# Patient Record
Sex: Female | Born: 1975
Health system: Southern US, Community
[De-identification: ages and names within clinical notes are randomized; demographics above are authoritative.]

## PROBLEM LIST (undated history)

## (undated) DIAGNOSIS — E559 Vitamin D deficiency, unspecified: Secondary | ICD-10-CM

## (undated) DIAGNOSIS — M4120 Other idiopathic scoliosis, site unspecified: Secondary | ICD-10-CM

## (undated) HISTORY — DX: Other idiopathic scoliosis, site unspecified: M41.20

## (undated) HISTORY — DX: Vitamin D deficiency, unspecified: E55.9

---

## 1998-10-24 HISTORY — PX: BREAST ENHANCEMENT SURGERY: SHX7

## 2003-10-25 HISTORY — PX: CHOLECYSTECTOMY: SHX55

## 2013-10-29 ENCOUNTER — Encounter: Payer: Self-pay | Admitting: Gastroenterology

## 2014-02-14 ENCOUNTER — Encounter: Payer: Self-pay | Admitting: Gastroenterology

## 2014-03-11 ENCOUNTER — Encounter: Payer: Self-pay | Admitting: Gastroenterology

## 2014-03-11 ENCOUNTER — Ambulatory Visit (INDEPENDENT_AMBULATORY_CARE_PROVIDER_SITE_OTHER): Payer: BC Managed Care – PPO | Admitting: Gastroenterology

## 2014-03-11 VITALS — BP 114/74 | HR 78 | Ht 66.0 in | Wt 152.0 lb

## 2014-03-11 DIAGNOSIS — Z800 Family history of malignant neoplasm of digestive organs: Secondary | ICD-10-CM | POA: Insufficient documentation

## 2014-03-11 MED ORDER — MOVIPREP 100 G PO SOLR: 1.0000 | Freq: Once | ORAL | Status: DC

## 2014-03-11 NOTE — Progress Notes (Signed)
03/11/2014 Donna Salazar [id number] November 12, 1975   HISTORY OF PRESENT ILLNESS: This is a pleasant 38 year old female who is known to Dr. Fuller Plan for previous colonoscopy in February 2005. Her colonoscopy at that time was normal. Her father died of colon cancer at age 93, just 6 months after being diagnosed. She presents for office today to schedule her colonoscopy, but also she recently had an episode of possible colonic ileus. She was complaining of some periumbilical abdominal pain, bloating, and nausea. She underwent a CT scan of the abdomen and pelvis with contrast on May 14 and was found to have nonspecific bowel gas pattern that was somewhat atypical with no absolute distention but with mild relative gaseous distention of the stomach and large bowel from the level of the cecum to the splenic flexure. The more distal large bowel was decompressed. They commented that these findings may be of no clinical consequence but possibility of a colonic ileus was considered. There was a possibility of internal hernia but no specific evidence to suggest that otherwise was noted. There was physiologic volume of free fluid in the cul-de-sac and pelvis. She underwent an abdominal x-ray a couple of days later and that reported nonspecific non-obstrcutive bowel gas pattern with clearance of colonic stool in the right colon and cecum along with no residual contrast in the colon. There was only mild gas in the hepatic flexure of the colon with no distention of the transverse colon.  There was paucity of bowel gas in the left colon, sigmoid colon, and rectum.    She says that overall she is feeling better, but still has some soreness just above her umbilicus; it is much improved, however.   Past Medical History  Diagnosis Date  . Vitamin D deficiency   . Scoliosis (and kyphoscoliosis), idiopathic    Past Surgical History  Procedure Laterality Date  . Cholecystectomy  2005    reports that she has never  smoked. She has never used smokeless tobacco. She reports that she does not drink alcohol or use illicit drugs. family history includes Colon cancer (age of onset: 66) in her father. Allergies  Allergen Reactions  . Cefdinir Rash  . Cephalosporins Rash  . Penicillins Rash      Outpatient Encounter Prescriptions as of 03/11/2014  Medication Sig  . CALCIUM-VITAMIN D PO Take by mouth daily.  . ondansetron (ZOFRAN-ODT) 4 MG disintegrating tablet Take 4 mg by mouth every 8 (eight) hours as needed for nausea.  Marland Kitchen MOVIPREP 100 G SOLR Take 1 kit (200 g total) by mouth once.  . [DISCONTINUED] methocarbamol (ROBAXIN-750) 750 MG tablet Take 750 mg by mouth every 6 (six) hours as needed for muscle spasms.  . [DISCONTINUED] traMADol (ULTRAM) 50 MG tablet Take 50 mg by mouth every 6 (six) hours as needed.     REVIEW OF SYSTEMS  : All other systems reviewed and negative except where noted in the History of Present Illness.   PHYSICAL EXAM: BP 114/74  Pulse 78  Ht 5' 6"  (1.676 m)  Wt 152 lb (68.947 kg)  BMI 24.55 kg/m2  LMP 02/24/2014 General: Well developed white female in no acute distress Head: Normocephalic and atraumatic Eyes:  Sclerae anicteric, conjunctiva pink. Ears: Normal auditory acuity  Lungs: Clear throughout to auscultation Heart: Regular rate and rhythm Abdomen: Soft, non-distended.  Normal bowel sounds.  Minimal diffuse TTP with slightly more tenderness just above the umbilicus.  No umbilical hernia noted. Rectal:  Deferred.  Will be done at the time of  colonoscopy. Musculoskeletal: Symmetrical with no gross deformities  Skin: No lesions on visible extremities Extremities: No edema  Neurological: Alert oriented x 4, grossly non-focal Psychological:  Alert and cooperative. Normal mood and affect  ASSESSMENT AND PLAN: -Family history of colon cancer in her father:  He died at age 48 just 67 months after he was diagnosed.  Will schedule colonoscopy with Dr. Fuller Plan.  The risks,  benefits, and alternatives were discussed with the patient and she consents to proceed.  -? Colonic ileus:  Now improved/resolved.

## 2014-03-11 NOTE — Patient Instructions (Signed)

## 2014-03-11 NOTE — Progress Notes (Signed)
Reviewed and agree with management plan.  Meosha Castanon T. Tyrone Pautsch, MD FACG 

## 2014-04-02 ENCOUNTER — Encounter: Payer: Self-pay | Admitting: Gastroenterology

## 2014-04-02 ENCOUNTER — Ambulatory Visit (AMBULATORY_SURGERY_CENTER): Payer: BC Managed Care – PPO | Admitting: Gastroenterology

## 2014-04-02 VITALS — BP 109/69 | HR 56 | Temp 97.9°F | Resp 16 | Ht 66.0 in | Wt 152.0 lb

## 2014-04-02 DIAGNOSIS — Z800 Family history of malignant neoplasm of digestive organs: Secondary | ICD-10-CM

## 2014-04-02 MED ORDER — SODIUM CHLORIDE 0.9 % IV SOLN: 500.0000 mL | INTRAVENOUS | Status: DC

## 2014-04-02 NOTE — Progress Notes (Signed)
No complaints noted in the recovery room. Maw   

## 2014-04-02 NOTE — Op Note (Signed)
[facility] [address] [place], [address]   COLONOSCOPY PROCEDURE REPORT  PATIENT: Donna Salazar, Donna Salazar  MR#: [id number] BIRTHDATE: [birth year] , 37  yrs. old GENDER: Female ENDOSCOPIST: Meryl Dare, MD, [facility] PROCEDURE DATE:  04/02/2014 PROCEDURE:   Colonoscopy, screening First Screening Colonoscopy - Avg.  risk and is 50 yrs.  old or older - No.  Prior Negative Screening - Now for repeat screening. 10 or more years since last screening  History of Adenoma - Now for follow-up colonoscopy & has been > or = to 3 yrs.  N/A  Polyps Removed Today? No.  Recommend repeat exam, <10 yrs? Yes.  High risk (family or personal hx). ASA CLASS:   Class I INDICATIONS:elevated risk screening: patient's immediate family history of colon cancer, father at 5. MEDICATIONS: MAC sedation, administered by CRNA and propofol (Diprivan) 350mg  IV DESCRIPTION OF PROCEDURE:   After the risks benefits and alternatives of the procedure were thoroughly explained, informed consent was obtained.  A digital rectal exam revealed no abnormalities of the rectum.   The LB [id number]  endoscope was introduced through the anus and advanced to the cecum, which was identified by both the appendix and ileocecal valve. No adverse events experienced.   The quality of the prep was excellent, using MoviPrep  The instrument was then slowly withdrawn as the colon was fully examined.  COLON FINDINGS: A normal appearing cecum, ileocecal valve, and appendiceal orifice were identified.  The ascending, hepatic flexure, transverse, splenic flexure, descending, sigmoid colon and rectum appeared unremarkable. Possible mild dilation of the ascending and transverse colon.  No polyps or cancers were seen. Retroflexed views revealed internal hemorrhoids. The time to cecum=3 minutes 27 seconds.  Withdrawal time=9 minutes 40 seconds. The scope was withdrawn and the procedure completed. COMPLICATIONS:  There were no complications.  ENDOSCOPIC IMPRESSION: 1.  Normal colon 2.  Small internal hemorrhoids  RECOMMENDATIONS: 1.  Repeat Colonoscopy in 5 years.  eSigned:  Meryl Dare, MD, [facility] 04/02/2014 11:33 AM   cc: Philemon Kingdom, MD

## 2014-04-02 NOTE — Progress Notes (Signed)
A/ox3 pleased with MAC, report to Annette RN 

## 2014-04-02 NOTE — Patient Instructions (Signed)
YOU HAD AN ENDOSCOPIC PROCEDURE TODAY AT THE  ENDOSCOPY CENTER: Refer to the procedure report that was given to you for any specific questions about what was found during the examination.  If the procedure report does not answer your questions, please call your gastroenterologist to clarify.  If you requested that your care partner not be given the details of your procedure findings, then the procedure report has been included in a sealed envelope for you to review at your convenience later.  YOU SHOULD EXPECT: Some feelings of bloating in the abdomen. Passage of more gas than usual.  Walking can help get rid of the air that was put into your GI tract during the procedure and reduce the bloating. If you had a lower endoscopy (such as a colonoscopy or flexible sigmoidoscopy) you may notice spotting of blood in your stool or on the toilet paper. If you underwent a bowel prep for your procedure, then you may not have a normal bowel movement for a few days.  DIET: Your first meal following the procedure should be a light meal and then it is ok to progress to your normal diet.  A half-sandwich or bowl of soup is an example of a good first meal.  Heavy or fried foods are harder to digest and may make you feel nauseous or bloated.  Likewise meals heavy in dairy and vegetables can cause extra gas to form and this can also increase the bloating.  Drink plenty of fluids but you should avoid alcoholic beverages for 24 hours.  ACTIVITY: Your care partner should take you home directly after the procedure.  You should plan to take it easy, moving slowly for the rest of the day.  You can resume normal activity the day after the procedure however you should NOT DRIVE or use heavy machinery for 24 hours (because of the sedation medicines used during the test).    SYMPTOMS TO REPORT IMMEDIATELY: A gastroenterologist can be reached at any hour.  During normal business hours, 8:30 AM to 5:00 PM Monday through Friday,  call [phone].  After hours and on weekends, please call the GI answering service at [phone] who will take a message and have the physician on call contact you.   Following lower endoscopy (colonoscopy or flexible sigmoidoscopy):  Excessive amounts of blood in the stool  Significant tenderness or worsening of abdominal pains  Swelling of the abdomen that is new, acute  Fever of 100F or higher  FOLLOW UP: If any biopsies were taken you will be contacted by phone or by letter within the next 1-3 weeks.  Call your gastroenterologist if you have not heard about the biopsies in 3 weeks.  Our staff will call the home number listed on your records the next business day following your procedure to check on you and address any questions or concerns that you may have at that time regarding the information given to you following your procedure. This is a courtesy call and so if there is no answer at the home number and we have not heard from you through the emergency physician on call, we will assume that you have returned to your regular daily activities without incident.  SIGNATURES/CONFIDENTIALITY: You and/or your care partner have signed paperwork which will be entered into your electronic medical record.  These signatures attest to the fact that that the information above on your After Visit Summary has been reviewed and is understood.  Full responsibility of the confidentiality of this   discharge information lies with you and/or your care-partner.    Handouts were given to your care partner on hemorrhoids and a high fiber diet with liberal fluid intake. You may resume your current medications today. Please call if any questions or concerns.       

## 2014-04-03 ENCOUNTER — Telehealth: Payer: Self-pay | Admitting: *Deleted

## 2014-04-03 NOTE — Telephone Encounter (Signed)
  Follow up Call-  Call back number 04/02/2014  Post procedure Call Back phone  # [phone]  Permission to leave phone message Yes     Patient questions:  Message left to call us if necessary.

## 2014-10-24 HISTORY — PX: OTHER SURGICAL HISTORY: SHX169

## 2017-11-21 DIAGNOSIS — J019 Acute sinusitis, unspecified: Secondary | ICD-10-CM | POA: Diagnosis not present

## 2017-11-21 DIAGNOSIS — R05 Cough: Secondary | ICD-10-CM | POA: Diagnosis not present

## 2018-07-27 DIAGNOSIS — M545 Low back pain: Secondary | ICD-10-CM | POA: Diagnosis not present

## 2018-07-27 DIAGNOSIS — Z6825 Body mass index (BMI) 25.0-25.9, adult: Secondary | ICD-10-CM | POA: Diagnosis not present

## 2018-09-07 DIAGNOSIS — R05 Cough: Secondary | ICD-10-CM | POA: Diagnosis not present

## 2019-04-11 ENCOUNTER — Encounter: Payer: Self-pay | Admitting: Gastroenterology

## 2020-01-06 ENCOUNTER — Ambulatory Visit (INDEPENDENT_AMBULATORY_CARE_PROVIDER_SITE_OTHER): Payer: 59 | Admitting: Critical Care Medicine

## 2020-01-06 ENCOUNTER — Encounter: Payer: Self-pay | Admitting: Critical Care Medicine

## 2020-01-06 ENCOUNTER — Other Ambulatory Visit: Payer: Self-pay

## 2020-01-06 VITALS — BP 100/72 | HR 83 | Temp 97.1°F | Ht 67.0 in | Wt 185.2 lb

## 2020-01-06 DIAGNOSIS — R05 Cough: Secondary | ICD-10-CM | POA: Diagnosis not present

## 2020-01-06 DIAGNOSIS — R053 Chronic cough: Secondary | ICD-10-CM

## 2020-01-06 MED ORDER — BUDESONIDE 0.5 MG/2ML IN SUSP: 0.5000 mg | Freq: Two times a day (BID) | RESPIRATORY_TRACT | 12 refills | Status: DC

## 2020-01-06 MED ORDER — BENZONATATE 200 MG PO CAPS: 200.0000 mg | ORAL_CAPSULE | Freq: Three times a day (TID) | ORAL | 1 refills | Status: DC | PRN

## 2020-01-06 MED ORDER — AZELASTINE HCL 0.1 % NA SOLN: 2.0000 | Freq: Two times a day (BID) | NASAL | 12 refills | Status: DC

## 2020-01-06 NOTE — Patient Instructions (Addendum)
Thank you for visiting Dr. Chestine Spore at [facility]. We recommend the following: Orders Placed This Encounter  Procedures  . Pulmonary function test   Orders Placed This Encounter  Procedures  . Pulmonary function test    Standing Status:   Future    Standing Expiration Date:   01/05/2021    Order Specific Question:   Where should this test be performed?    Answer:   [facility]    Order Specific Question:   Full PFT: includes the following: basic spirometry, spirometry pre & post bronchodilator, diffusion capacity (DLCO), lung volumes    Answer:   Full PFT    Meds ordered this encounter  Medications  . azelastine (ASTELIN) 0.1 % nasal spray    Sig: Place 2 sprays into both nostrils 2 (two) times daily. Use in each nostril as directed    Dispense:  30 mL    Refill:  12  . budesonide (PULMICORT) 0.5 MG/2ML nebulizer solution    Sig: Take 2 mLs (0.5 mg total) by nebulization 2 (two) times daily.    Dispense:  120 mL    Refill:  12  . benzonatate (TESSALON) 200 MG capsule    Sig: Take 1 capsule (200 mg total) by mouth 3 (three) times daily as needed for cough.    Dispense:  90 capsule    Refill:  1    Return in about 4 weeks (around 02/03/2020).    Please do your part to reduce the spread of COVID-19.

## 2020-01-06 NOTE — Progress Notes (Signed)
Synopsis: Referred in 2021 for chronic cough by Ernestene Kiel, MD  Subjective:   PATIENT ID: Donna Salazar GENDER: female DOB: February 18, 1976, MRN: [id number]  Chief Complaint  Patient presents with  . Consult    Patient is here for cough that she has had for about 2 months. Cough is sometimes productive with clear sputum and sometimes dry. Patient also has shortness of breath with exertion that started a few weeks after the cough.     Donna Salazar is a 44 year old woman who presents for evaluation of chronic cough.  This began before Christmas 2020, which she attributed originally to a new laundry detergent, but her cough failed to resolve after switching.  She has had multiple prolonged coughing episodes over the past few years, but not associated with fatigue as this 1 is.  Last year this occurred in November, previous year it happened during the winter months as this has.  Has had multiple negative Covid antibody tests when donating blood recently.  She has a history of chronic allergies for which she takes Claritin-D and Nasacort as needed.  Her cough is sometimes dry, but productive at times, especially when leaning over or when in the shower.  Over time it has become less deep and less frequent with mild improvement in her shortness of breath.  She has had dyspnea on exertion with walking up stairs and has not been able to do her usual levels of activity due to coughing.  No wheezing.  On 1/17 she was treated with clindamycin and prednisone, with mild benefit.  On 1/29 she was started on a azithromycin, prednisone, albuterol.  At that time an albuterol nebulizer treatment did not help.  On 2/3 she was started on levofloxacin for a week.  She has never had fevers, sinus pressure, or discolored mucus during this.  She had some dyspnea on exertion, which is improving.  She has been on Pulmicort for about 3 weeks, but stopped recently, and did not perceive a benefit.  Her nose is  clear, but she sounds congested.  She has had postnasal drip.  Her nose is dry and she has had some blood clots when blowing her nose in the past few weeks.  She has had hoarseness for the past few weeks since stopping Pulmicort.  She has no previous history of reflux other than pregnancy.  She was having reflux when she was going to bed with cough drops in her mouth, which is resolved since she stopped doing this.  She wakes up with a metallic taste in her mouth that began when she was on levofloxacin, but has persisted.  She has no personal or family history of asthma or other lung disease.  She had a chest x-ray last week which is reported as normal.  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  She has no history of tobacco abuse or vaping.  No history of anemia or rheumatologic disease.  Current meds include omeprazole, Nasacort, Claritin, Singulair since December.   PCP note, Dr. Laqueta Due 11/22/2019 reviewed.      Past Medical History:  Diagnosis Date  . Scoliosis (and kyphoscoliosis), idiopathic   . Vitamin D deficiency      Family History  Problem Relation Age of Onset  . Colon cancer Father 33     Past Surgical History:  Procedure Laterality Date  . CHOLECYSTECTOMY  2005    Social History   Socioeconomic History  .  Marital status: Unknown    Spouse name: Not on file  . Number of children: 1  . Years of education: Not on file  . Highest education level: Not on file  Occupational History  . Not on file  Tobacco Use  . Smoking status: Never Smoker  . Smokeless tobacco: Never Used  Substance and Sexual Activity  . Alcohol use: No  . Drug use: No  . Sexual activity: Not on file  Other Topics Concern  . Not on file  Social History Narrative  . Not on file   Social Determinants of Health   Financial Resource Strain:   . Difficulty of Paying Living Expenses:   Food Insecurity:   . Worried About Patent examiner in the Last Year:   . Barista in the Last Year:   Transportation Needs:   . Freight forwarder (Medical):   Marland Kitchen Lack of Transportation (Non-Medical):   Physical Activity:   . Days of Exercise per Week:   . Minutes of Exercise per Session:   Stress:   . Feeling of Stress :   Social Connections:   . Frequency of Communication with Friends and Family:   . Frequency of Social Gatherings with Friends and Family:   . Attends Religious Services:   . Active Member of Clubs or Organizations:   . Attends Banker Meetings:   Marland Kitchen Marital Status:   Intimate Partner Violence:   . Fear of Current or Ex-Partner:   . Emotionally Abused:   Marland Kitchen Physically Abused:   . Sexually Abused:      Allergies  Allergen Reactions  . Cefdinir Rash  . Cephalosporins Rash  . Penicillins Rash     Immunization History  Administered Date(s) Administered  . Influenza,inj,Quad PF,6-35 Mos 07/09/2019  . Influenza-Unspecified 06/24/2018    Outpatient Medications Prior to Visit  Medication Sig Dispense Refill  . albuterol (PROVENTIL) (2.5 MG/3ML) 0.083% nebulizer solution Inhale 2.5 mg into the lungs as needed.    . brompheniramine-pseudoephedrine-DM 30-2-10 MG/5ML syrup Take 1.25 mLs by mouth 4 (four) times daily as needed.    Marland Kitchen CALCIUM-VITAMIN D PO Take by mouth daily.    Marland Kitchen loratadine (CLARITIN) 10 MG tablet Take 10 mg by mouth daily.    . montelukast (SINGULAIR) 10 MG tablet Take 10 mg by mouth daily as needed.    Marland Kitchen omeprazole (PRILOSEC) 40 MG capsule Take 40 mg by mouth daily.    . ondansetron (ZOFRAN-ODT) 4 MG disintegrating tablet Take 4 mg by mouth every 8 (eight) hours as needed for nausea.    . Triamcinolone Acetonide (NASACORT AQ NA) Place 1 spray into the nose daily.     No facility-administered medications prior to visit.    Review of Systems  Constitutional: Negative for chills and fever.  HENT: Positive for congestion.   Respiratory: Positive for cough and  shortness of breath. Negative for wheezing.   Cardiovascular: Negative for chest pain and leg swelling.  Gastrointestinal: Negative for heartburn and nausea.     Objective:   Vitals:   01/06/20 0907  BP: 100/72  Pulse: 83  Temp: (!) 97.1 F (36.2 C)  TempSrc: Temporal  SpO2: 94%  Weight: 185 lb 3.2 oz (84 kg)  Height: 5\' 7"  (1.702 m)   94% on   RA BMI Readings from Last 3 Encounters:  01/06/20 29.01 kg/m  04/02/14 24.53 kg/m  03/11/14 24.53 kg/m   Wt Readings from Last 3 Encounters:  01/06/20 185  lb 3.2 oz (84 kg)  04/02/14 152 lb (68.9 kg)  03/11/14 152 lb (68.9 kg)    Physical Exam Vitals reviewed.  Constitutional:      General: She is not in acute distress.    Appearance: She is not ill-appearing.  HENT:     Head: Normocephalic and atraumatic.  Eyes:     General: No scleral icterus. Cardiovascular:     Rate and Rhythm: Normal rate and regular rhythm.  Pulmonary:     Comments: Breathing comfortably on room air, no conversational dyspnea.  Clear to auscultation bilaterally.  Occasional dry coughing during encounter. Abdominal:     General: There is no distension.     Palpations: Abdomen is soft.     Tenderness: There is no abdominal tenderness.  Musculoskeletal:        General: No swelling or deformity.     Cervical back: Neck supple.  Lymphadenopathy:     Cervical: No cervical adenopathy.  Skin:    General: Skin is warm and dry.     Findings: No rash.  Neurological:     General: No focal deficit present.     Mental Status: She is alert.     Motor: No weakness.     Coordination: Coordination normal.  Psychiatric:        Mood and Affect: Mood normal.        Behavior: Behavior normal.      CBC No results found for: WBC, RBC, HGB, HCT, PLT, MCV, MCH, MCHC, RDW, LYMPHSABS, MONOABS, EOSABS, BASOSABS  CHEMISTRY No results for input(s): NA, K, CL, CO2, GLUCOSE, BUN, CREATININE, CALCIUM, MG, PHOS in the last 168 hours. CrCl cannot be calculated (No  successful lab value found.).   Chest Imaging- Report only available: -CXR 12/27/2019-normal CXR  Pulmonary Functions Testing Results: No flowsheet data found.      Assessment & Plan:     ICD-10-CM   1. Chronic cough  R05 Pulmonary function test     Chronic cough and nasal congestion, refractory to multiple doses of antibiotics -Continue Singulair, Claritin, Nasacort daily -Continue omeprazole -Start azelastine nasal spray twice daily -Restart Pulmicort nebs twice daily.  Continue albuterol as needed.  Would like to continue Pulmicort until follow-up PFTs -No ACE inhibitor or other medications which explain her symptoms. -Recommend voice rest for a few days to reduce throat irritation which could be propagating cough.  During this time she should aggressively suppress her cough with antitussives. -Tessalon every 8 hours as needed  RTC in 2 months after PFTs.    Current Outpatient Medications:  .  albuterol (PROVENTIL) (2.5 MG/3ML) 0.083% nebulizer solution, Inhale 2.5 mg into the lungs as needed., Disp: , Rfl:  .  brompheniramine-pseudoephedrine-DM 30-2-10 MG/5ML syrup, Take 1.25 mLs by mouth 4 (four) times daily as needed., Disp: , Rfl:  .  CALCIUM-VITAMIN D PO, Take by mouth daily., Disp: , Rfl:  .  loratadine (CLARITIN) 10 MG tablet, Take 10 mg by mouth daily., Disp: , Rfl:  .  montelukast (SINGULAIR) 10 MG tablet, Take 10 mg by mouth daily as needed., Disp: , Rfl:  .  omeprazole (PRILOSEC) 40 MG capsule, Take 40 mg by mouth daily., Disp: , Rfl:  .  ondansetron (ZOFRAN-ODT) 4 MG disintegrating tablet, Take 4 mg by mouth every 8 (eight) hours as needed for nausea., Disp: , Rfl:  .  Triamcinolone Acetonide (NASACORT AQ NA), Place 1 spray into the nose daily., Disp: , Rfl:  .  azelastine (ASTELIN) 0.1 % nasal  spray, Place 2 sprays into both nostrils 2 (two) times daily. Use in each nostril as directed, Disp: 30 mL, Rfl: 12 .  benzonatate (TESSALON) 200 MG capsule, Take 1  capsule (200 mg total) by mouth 3 (three) times daily as needed for cough., Disp: 90 capsule, Rfl: 1 .  budesonide (PULMICORT) 0.5 MG/2ML nebulizer solution, Take 2 mLs (0.5 mg total) by nebulization 2 (two) times daily., Disp: 120 mL, Rfl: 12     Steffanie Dunn, DO [facility] 01/06/2020 9:39 AM

## 2020-01-16 LAB — PULMONARY FUNCTION TEST

## 2020-01-30 ENCOUNTER — Telehealth: Payer: Self-pay | Admitting: Critical Care Medicine

## 2020-01-30 DIAGNOSIS — R0600 Dyspnea, unspecified: Secondary | ICD-10-CM

## 2020-01-30 DIAGNOSIS — R0609 Other forms of dyspnea: Secondary | ICD-10-CM

## 2020-01-30 NOTE — Telephone Encounter (Signed)
I received spirometry results from Dr. Donita Brooks office:  PFT 01/16/20: FVC 4.34 (108%) FEV1  3.7 (114%) ratio 85 DLCO 20.2 (71%) uncorrected   We still need lung volumes to better understand her diffusion impairment. I will also order an echo and CBC.    Steffanie Dunn, DO 01/30/20 1:58 PM [facility]

## 2020-02-03 NOTE — Telephone Encounter (Signed)
Called and spoke with patient and she states that she had a full PFT done already, had blood work done last week at her PCP and that her PCP was ordering an ECHO.  Informed patient that we need copies of the full PFT report, Copies of recent blood work and to let us know once her ECHO is scheduled with her PCP and we will cancel the other one. Informed her that once we got all paperwork for above tests we would then cancel the orders that Dr. Chestine Spore ordered. Will keep an eye out for paperwork.

## 2020-02-04 NOTE — Telephone Encounter (Signed)
Thanks [place]!  LPC

## 2020-02-05 ENCOUNTER — Ambulatory Visit
Admission: RE | Admit: 2020-02-05 | Discharge: 2020-02-05 | Disposition: A | Discharge status: Home / Self Care | Payer: 59 | Source: Ambulatory Visit | Attending: Internal Medicine | Admitting: Internal Medicine

## 2020-02-05 ENCOUNTER — Ambulatory Visit: Payer: 59 | Admitting: Critical Care Medicine

## 2020-02-05 ENCOUNTER — Other Ambulatory Visit: Payer: Self-pay | Admitting: Internal Medicine

## 2020-02-05 DIAGNOSIS — R05 Cough: Secondary | ICD-10-CM

## 2020-02-05 DIAGNOSIS — R0602 Shortness of breath: Secondary | ICD-10-CM

## 2020-02-05 DIAGNOSIS — R059 Cough, unspecified: Secondary | ICD-10-CM

## 2020-02-05 DIAGNOSIS — R942 Abnormal results of pulmonary function studies: Secondary | ICD-10-CM

## 2020-02-05 IMAGING — CT CT ANGIO CHEST
2 of 8 series · 10 of 36 positions shown · IV contrast (iopamidol)
Comparison: Chest radiograph [DATE]

CLINICAL DATA: Shortness of breath, cough, abnormal PFTs, evaluate
for pulmonary embolism

EXAM:
CT ANGIOGRAPHY CHEST WITH CONTRAST
TECHNIQUE: Multidetector CT imaging of the chest was performed using the
standard protocol during bolus administration of intravenous
contrast. Multiplanar CT image reconstructions and MIPs were
obtained to evaluate the vascular anatomy.
CONTRAST:  75mL [C8] IOPAMIDOL ([C8]) INJECTION 76%

[Series 7: cta pulmonary 2.00 bv36 s3 · coronal · 0.56mm/px · 1 of 150 slices shown]
[im 75/150  mediastinal]
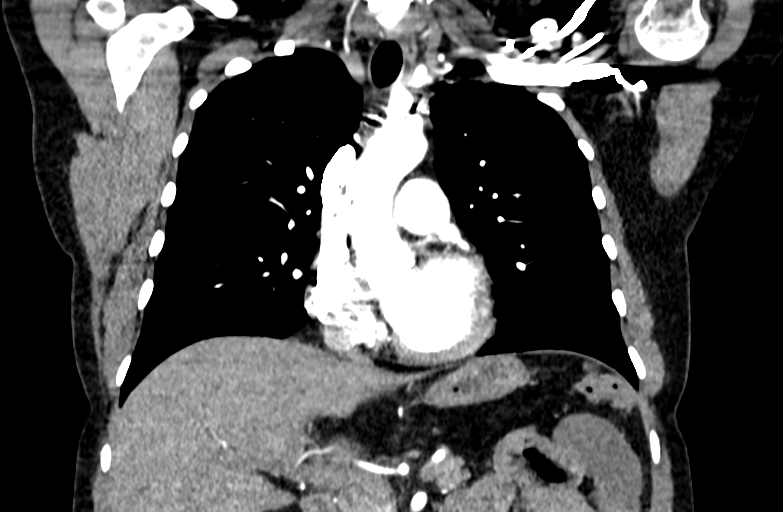

[Series 12: cta pulmonary 1.00 bv36 s3 super d. · axial · 0.84mm/px · z∈[+1651,+1878]mm · 9 of 355 slices shown]
[im 36/355  lung]
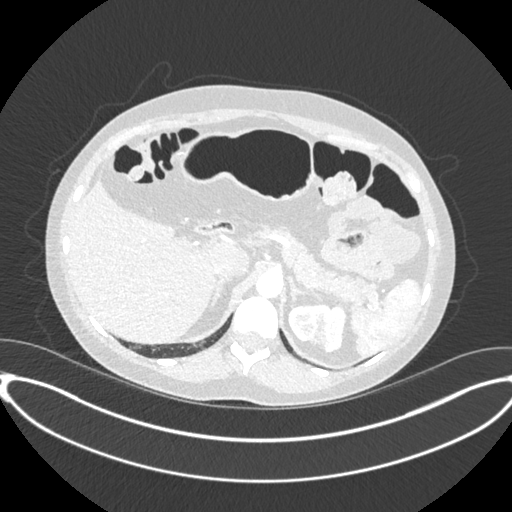
[im 71/355  mediastinal]
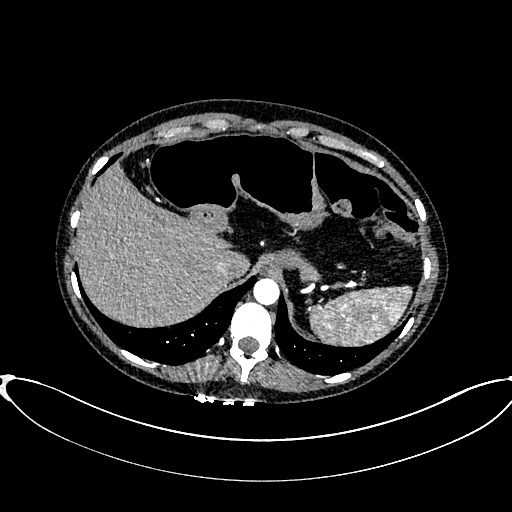
[im 107/355  lung]
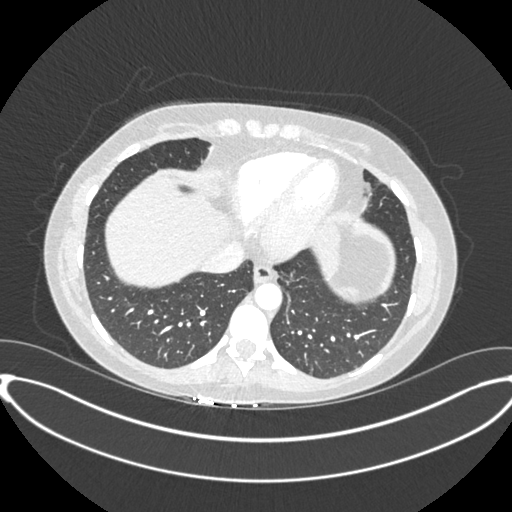
[im 142/355  mediastinal]
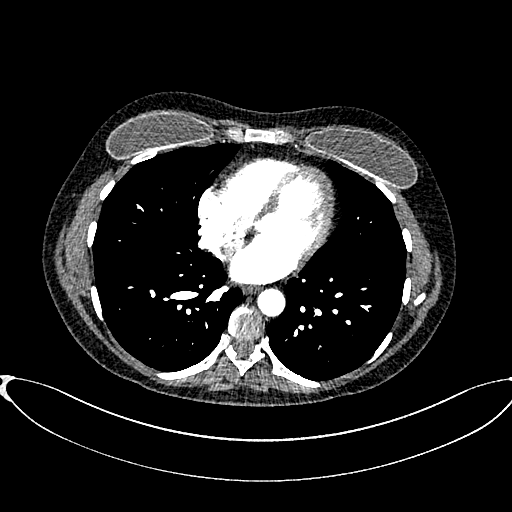
[im 178/355  lung]
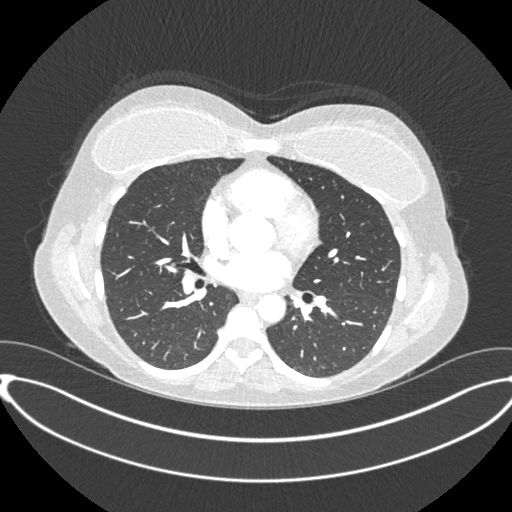
[im 213/355  mediastinal]
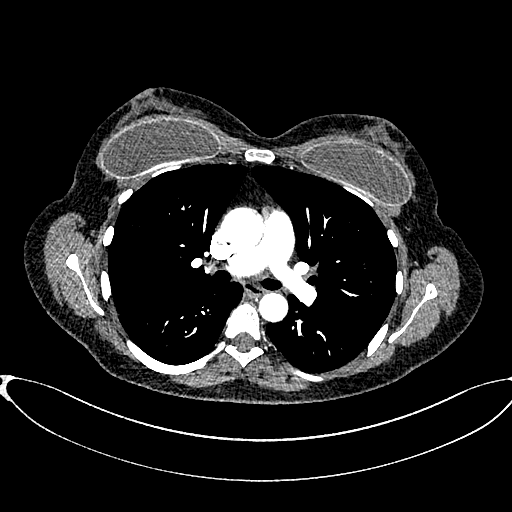
[im 248/355  lung]
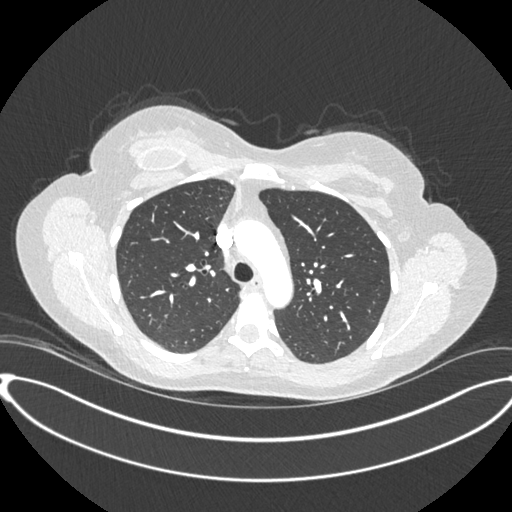
[im 284/355  mediastinal]
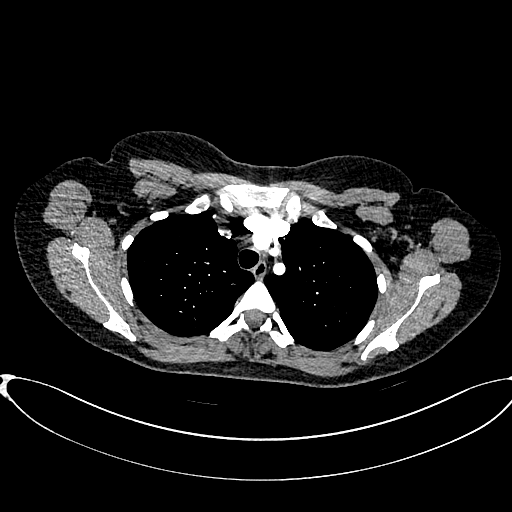
[im 319/355  lung]
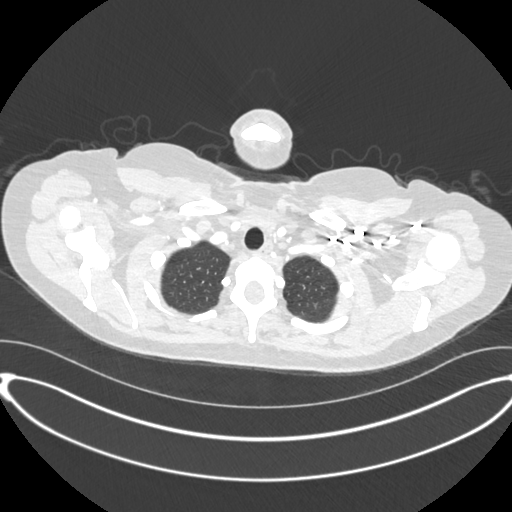

[10 of 36 positions shown; findings below may reference images not displayed]

FINDINGS: Cardiovascular: Satisfactory opacification the pulmonary arteries to
the segmental level. No pulmonary artery filling defects are
identified. Central pulmonary arteries are normal caliber. Normal
heart size. No pericardial effusion. The aorta is normal caliber. No
periaortic stranding, hemorrhage or other acute periaortic
abnormality. Normal 3 vessel branching of the aortic arch. Proximal
great vessels are unremarkable.

Mediastinum/Nodes: No mediastinal fluid or gas. Normal thyroid gland
and thoracic inlet. No acute abnormality of the trachea or
esophagus. No worrisome mediastinal, hilar or axillary adenopathy.

Lungs/Pleura: No consolidation, features of edema, pneumothorax, or
effusion. No suspicious pulmonary nodules or masses.

Upper Abdomen: No acute abnormalities present in the visualized
portions of the upper abdomen.

Musculoskeletal: No acute osseous abnormality or suspicious osseous
lesion. Bilateral subpectoral breast prostheses. No worrisome chest
wall lesion.

Review of the MIP images confirms the above findings.
IMPRESSION: 1. No acute pulmonary artery filling defects to suggest pulmonary
embolism.
2. No acute intrathoracic process.

## 2020-02-05 MED ORDER — IOPAMIDOL (ISOVUE-370) INJECTION 76%
75.0000 mL | Freq: Once | INTRAVENOUS | Status: AC | PRN
  Administered 2020-02-05: 75 mL via INTRAVENOUS

## 2020-02-12 ENCOUNTER — Other Ambulatory Visit (HOSPITAL_COMMUNITY): Payer: 59

## 2020-03-20 ENCOUNTER — Other Ambulatory Visit (HOSPITAL_COMMUNITY): Payer: 59

## 2020-04-07 DIAGNOSIS — R053 Chronic cough: Secondary | ICD-10-CM | POA: Insufficient documentation

## 2020-10-07 LAB — PULMONARY FUNCTION TEST

## 2020-10-08 ENCOUNTER — Ambulatory Visit (INDEPENDENT_AMBULATORY_CARE_PROVIDER_SITE_OTHER): Payer: 59 | Admitting: Allergy and Immunology

## 2020-10-08 ENCOUNTER — Other Ambulatory Visit: Payer: Self-pay

## 2020-10-08 VITALS — BP 108/80 | HR 96 | Temp 99.0°F | Resp 20 | Ht 65.85 in | Wt 188.7 lb

## 2020-10-08 DIAGNOSIS — J4530 Mild persistent asthma, uncomplicated: Secondary | ICD-10-CM

## 2020-10-08 DIAGNOSIS — K219 Gastro-esophageal reflux disease without esophagitis: Secondary | ICD-10-CM

## 2020-10-08 MED ORDER — ALBUTEROL SULFATE (2.5 MG/3ML) 0.083% IN NEBU: 2.5000 mg | INHALATION_SOLUTION | Freq: Four times a day (QID) | RESPIRATORY_TRACT | 1 refills | Status: DC | PRN

## 2020-10-08 MED ORDER — DEXILANT 60 MG PO CPDR: 60.0000 mg | DELAYED_RELEASE_CAPSULE | Freq: Every day | ORAL | 1 refills | Status: DC

## 2020-10-08 MED ORDER — ALBUTEROL SULFATE HFA 108 (90 BASE) MCG/ACT IN AERS: INHALATION_SPRAY | RESPIRATORY_TRACT | 1 refills | Status: DC

## 2020-10-08 NOTE — Progress Notes (Signed)
[place] - [place] - [place] - [place] - [place]   NEW PATIENT NOTE  Referring Provider: No ref. provider found Primary Provider: Philemon Kingdom, MD Date of office visit: 10/08/2020    Subjective:   Chief Complaint:  Donna Salazar (DOB: 1976-03-20) is a 44 y.o. female who presents to the clinic on 10/08/2020 with a chief complaint of Cough and Asthma .  HPI: [name] presents to this clinic in evaluation of cough.  She has a 1 year history of cough.  This is a cough associated with gagging and retching and spells of cough with occasional posttussive urination that has been evaluated by multiple physicians including an allergist, pulmonologist, and ENT.  She has had imaging of her airway including a CT scan and has recently undergone a methacholine challenge test.  Her cough was originally associated with some shortness of breath and dyspnea on exertion but that has since resolved and now she just has intermittent cough that requires her to use a albuterol MDI or nebulization 1 or 2 times per day.  At this point in time she thinks that she may get some help while using his nebulization.  In the past she has been treated with a multitude of different agents including various anti-inflammatory agents for her airway and therapy directed against reflux, none of which really helped her to any degree.  She sometimes does get burning in her chest if she uses cough drops but otherwise has no symptoms of reflux.  She does drink 1 Coke per day.  It appears that exposure to fumes and scents and talking too much precipitates these coughing episodes.  Past Medical History:  Diagnosis Date   Scoliosis (and kyphoscoliosis), idiopathic    Vitamin D deficiency     Past Surgical History:  Procedure Laterality Date   CHOLECYSTECTOMY  2005    Allergies as of 10/08/2020      Reactions   Cefdinir Rash   Cephalosporins Rash   Penicillins Rash      Medication List       albuterol (2.5 MG/3ML) 0.083% nebulizer solution Commonly known as: PROVENTIL Inhale 2.5 mg into the lungs as needed.   azelastine 0.1 % nasal spray Commonly known as: ASTELIN Place 2 sprays into both nostrils 2 (two) times daily. Use in each nostril as directed   benzonatate 200 MG capsule Commonly known as: TESSALON Take 1 capsule (200 mg total) by mouth 3 (three) times daily as needed for cough.   brompheniramine-pseudoephedrine-DM 30-2-10 MG/5ML syrup Take 1.25 mLs by mouth 4 (four) times daily as needed.   budesonide 0.5 MG/2ML nebulizer solution Commonly known as: Pulmicort Take 2 mLs (0.5 mg total) by nebulization 2 (two) times daily.   CALCIUM-VITAMIN D PO Take by mouth daily.   loratadine 10 MG tablet Commonly known as: CLARITIN Take 10 mg by mouth daily.   montelukast 10 MG tablet Commonly known as: SINGULAIR Take 10 mg by mouth daily as needed.   NASACORT AQ NA Place 1 spray into the nose daily.   omeprazole 40 MG capsule Commonly known as: PRILOSEC Take 40 mg by mouth daily.   ondansetron 4 MG disintegrating tablet Commonly known as: ZOFRAN-ODT Take 4 mg by mouth every 8 (eight) hours as needed for nausea.       Review of systems negative except as noted in HPI / PMHx or noted below:  Review of Systems  Constitutional: Negative.   HENT: Negative.   Eyes: Negative.   Respiratory: Negative.  Cardiovascular: Negative.   Gastrointestinal: Negative.   Genitourinary: Negative.   Musculoskeletal: Negative.   Skin: Negative.   Neurological: Negative.   Endo/Heme/Allergies: Negative.   Psychiatric/Behavioral: Negative.     Family History  Problem Relation Age of Onset   Colon cancer Father 72    Social History   Socioeconomic History   Marital status: Married    Spouse name: Not on file   Number of children: 1   Years of education: Not on file   Highest education level: Not on file  Occupational History   Not on file  Tobacco  Use   Smoking status: Never Smoker   Smokeless tobacco: Never Used  Vaping Use   Vaping Use: Never used  Substance and Sexual Activity   Alcohol use: No   Drug use: No   Sexual activity: Not on file  Other Topics Concern   Not on file  Social History Narrative   Not on file    Objective:   Vitals:   10/08/20 1424  BP: 108/80  Pulse: 96  Resp: 20  Temp: 99 F (37.2 C)  SpO2: 96%   Height: 5' 5.85" (167.3 cm) Weight: 188 lb 11.4 oz (85.6 kg)  Physical Exam Constitutional:      Appearance: She is not diaphoretic.  HENT:     Head: Normocephalic. No right periorbital erythema or left periorbital erythema.     Right Ear: Tympanic membrane, ear canal and external ear normal.     Left Ear: Tympanic membrane, ear canal and external ear normal.     Nose: Nose normal. No mucosal edema or rhinorrhea.     Mouth/Throat:     Mouth: Oropharynx is clear and moist and mucous membranes are normal.     Pharynx: Uvula midline. No oropharyngeal exudate.  Eyes:     General: Lids are normal.     Conjunctiva/sclera: Conjunctivae normal.     Pupils: Pupils are equal, round, and reactive to light.  Neck:     Thyroid: No thyromegaly.     Trachea: Trachea normal. No tracheal tenderness or tracheal deviation.  Cardiovascular:     Rate and Rhythm: Normal rate and regular rhythm.     Heart sounds: Normal heart sounds, S1 normal and S2 normal. No murmur heard.   Pulmonary:     Effort: Pulmonary effort is normal. No respiratory distress.     Breath sounds: Normal breath sounds. No stridor. No wheezing or rales.  Chest:     Chest wall: No tenderness.  Abdominal:     General: There is no distension.     Palpations: Abdomen is soft. There is no hepatosplenomegaly or mass.     Tenderness: There is no abdominal tenderness. There is no guarding or rebound.  Musculoskeletal:        General: No tenderness or edema.  Lymphadenopathy:     Head:     Right side of head: No tonsillar  adenopathy.     Left side of head: No tonsillar adenopathy.     Cervical: No cervical adenopathy.     Upper Body:  No axillary adenopathy present. Skin:    Coloration: Skin is not pale.     Findings: No erythema or rash.     Nails: There is no clubbing.  Neurological:     Mental Status: She is alert.     Diagnostics: Allergy skin tests were not performed.   Review of skin tests performed 07 January 2020 by local allergist identified hypersensitivity against  tree pollen, grass pollen, weed pollen, molds, dog, dust mite  Results of a CT angio chest obtained 05 February 2020 identified the following:  Cardiovascular: Satisfactory opacification the pulmonary arteries to the segmental level. No pulmonary artery filling defects are identified. Central pulmonary arteries are normal caliber. Normal heart size. No pericardial effusion. The aorta is normal caliber. No periaortic stranding, hemorrhage or other acute periaortic abnormality. Normal 3 vessel branching of the aortic arch. Proximal great vessels are unremarkable.  Mediastinum/Nodes: No mediastinal fluid or gas. Normal thyroid gland and thoracic inlet. No acute abnormality of the trachea or esophagus. No worrisome mediastinal, hilar or axillary adenopathy.  Lungs/Pleura: No consolidation, features of edema, pneumothorax, or effusion. No suspicious pulmonary nodules or masses.  Results of blood tests obtained 29 January 2020 identifies WBC 6.1, absolute eosinophil 100, absolute lymphocyte 1400, AST 20 5U/L, ALT 18 U/L, C3 145 NG/DL, C4 29 mg/DL, sed rate 15, TSH 6.38 IU/mL, T4 6.9 UG/DL results of blood tests obtained 29 January 2020 identified WBC 6.1, absolute eosinophil 100, absolute lymphocyte 1400, hemoglobin 13.2, ALT 18 U/L, AST 20 5U/L, creatinine 0.82 mg/DL, C3 937 NG/DL, C4 20 9U/L.  Assessment and Plan:    1. Not well controlled mild persistent asthma   2. LPRD (laryngopharyngeal reflux disease)     1.  Can start a  course of immunotherapy against dust mite, dog, pollens  2.  Treated a inflamed and irritable airway:   A. Alvesco 160 - 1 inhalation 1 time per day with spacer  B. Dexilant 60 mg - 1 tablet 1 time per day  C. Substitute cough with swallowing maneuver  3.  If needed:   A.  Albuterol MDI 2 inhalations or nebulization every 4-6 hours  4.  Review results of methacholine challenge test  5.  Return to clinic in 4 weeks or earlier if problem  Breanah appears to have a multifactorial form of cough most likely from contribution of inflammation and reflux and we will treat her with the therapy noted above.  Certainly she can start a course of immunotherapy against dust mite and dogs and pollens but if a large component of her cough is secondary to reflux this form of immunotherapy will not really affect that process.  I will review the results of her methacholine challenge test to see if there is bronchial hyperresponsiveness that is contributing to her cough.  She may be a candidate for the P2 X3 receptor antagonist once it is available in 2022.  Laurette Schimke, MD Allergy / Immunology [facility]

## 2020-10-08 NOTE — Patient Instructions (Addendum)
  1.  Can start a course of immunotherapy against dust mite, dog, pollens  2.  Treated a inflamed and irritable airway:   A. Alvesco 160 - 1 inhalation 1 time per day with spacer  B. Dexilant 60 mg - 1 tablet 1 time per day  C. Substitute cough with swallowing maneuver  3.  If needed:   A.  Albuterol MDI 2 inhalations or nebulization every 4-6 hours  4.  Review results of methacholine challenge test  5.  Return to clinic in 4 weeks or earlier if problem

## 2020-10-13 ENCOUNTER — Encounter: Payer: Self-pay | Admitting: Allergy and Immunology

## 2020-10-14 ENCOUNTER — Other Ambulatory Visit: Payer: Self-pay | Admitting: Allergy and Immunology

## 2020-10-14 DIAGNOSIS — J4530 Mild persistent asthma, uncomplicated: Secondary | ICD-10-CM

## 2020-10-15 DIAGNOSIS — J3081 Allergic rhinitis due to animal (cat) (dog) hair and dander: Secondary | ICD-10-CM

## 2020-10-15 NOTE — Progress Notes (Signed)
VIALS EXP 10-15-21 

## 2020-10-15 NOTE — Progress Notes (Signed)
Aeroallergen Immunotherapy   Patient Details  Name: Donna Salazar  MRN: [id number]  Date of Birth: [birth year]   Order two of two   Vial Label: dust mite, weed   0.3 ml (Volume) 1:20 Concentration -- Ragweed Mix  0.5 ml (Volume) 1:20 Concentration -- Weed Mix*  0.5 ml (Volume)  AU Concentration -- Mite Mix (DF 5,000 & DP 5,000)    1.3 ml Extract Subtotal  3.7 ml Diluent  5.0 ml Maintenance Total    Final Concentration above is stated in weight/volume (wt/vol). Allergen units (AU/ml) biological units (BAU/ml). The total volume is 5 ml.    Schedule: B   Special Instructions: 1-2 times per week

## 2020-10-15 NOTE — Progress Notes (Signed)
Aeroallergen Immunotherapy    Patient Details  Name: HAANI BAKULA  MRN: [id number]  Date of Birth: 1975-12-07   Order one of two   Vial Label: tree, grass, dog   0.3 ml (Volume) BAU Concentration -- 7 Grass Mix* 100,000 ([address] [place], [place], [place], [place], [place], [place], [place])  0.1 ml (Volume) BAU Concentration -- [place] 10,000  0.1 ml (Volume) 1:20 Concentration -- [place]  0.2 ml (Volume) 1:20 Concentration -- Ash mix*  0.5 ml (Volume) 1:10 Concentration -- Dog Epithelia    1.2 ml Extract Subtotal  3.8 ml Diluent  5.0 ml Maintenance Total    Final Concentration above is stated in weight/volume (wt/vol). Allergen units (AU/ml) biological units (BAU/ml). The total volume is 5 ml.    Schedule: B   Special Instructions: 1-2 times per week

## 2020-10-19 ENCOUNTER — Encounter: Payer: Self-pay | Admitting: Allergy and Immunology

## 2020-10-21 ENCOUNTER — Encounter: Payer: Self-pay | Admitting: *Deleted

## 2020-10-21 DIAGNOSIS — J3089 Other allergic rhinitis: Secondary | ICD-10-CM

## 2020-10-21 NOTE — Progress Notes (Signed)
Labels for CM

## 2020-10-28 ENCOUNTER — Other Ambulatory Visit: Payer: Self-pay

## 2020-10-28 ENCOUNTER — Ambulatory Visit (INDEPENDENT_AMBULATORY_CARE_PROVIDER_SITE_OTHER): Payer: 59

## 2020-10-28 DIAGNOSIS — J309 Allergic rhinitis, unspecified: Secondary | ICD-10-CM

## 2020-10-28 MED ORDER — AUVI-Q 0.3 MG/0.3ML IJ SOAJ: INTRAMUSCULAR | 3 refills | Status: DC

## 2020-10-28 NOTE — Progress Notes (Signed)
Patient started allergy injections for DMITE-WEED and TREE-GRASS-DOG. Started Blue vial (1:100,000), 1-2 times weekly, Schedule B.  Signed consent form and went over allergic reaction symptoms.  Sent in [place] for Auvi-q 0.3.  Patient waited for approximately 30 minutes and only had a little bit of itching.  I told her to let us know if she develops anything else and that she can always take an antihistamine.

## 2020-10-30 ENCOUNTER — Encounter: Payer: Self-pay | Admitting: Allergy and Immunology

## 2020-11-05 ENCOUNTER — Encounter: Payer: Self-pay | Admitting: Allergy and Immunology

## 2020-11-05 ENCOUNTER — Ambulatory Visit (INDEPENDENT_AMBULATORY_CARE_PROVIDER_SITE_OTHER): Payer: 59 | Admitting: Allergy and Immunology

## 2020-11-05 ENCOUNTER — Other Ambulatory Visit: Payer: Self-pay

## 2020-11-05 VITALS — BP 118/82 | HR 100 | Resp 20

## 2020-11-05 DIAGNOSIS — J309 Allergic rhinitis, unspecified: Secondary | ICD-10-CM | POA: Diagnosis not present

## 2020-11-05 DIAGNOSIS — D7218 Eosinophilia in diseases classified elsewhere: Secondary | ICD-10-CM | POA: Diagnosis not present

## 2020-11-05 DIAGNOSIS — J40 Bronchitis, not specified as acute or chronic: Secondary | ICD-10-CM | POA: Diagnosis not present

## 2020-11-05 NOTE — Progress Notes (Signed)
[place] - [place] - [place] - [place] - [place]   Follow-up Note  Referring Provider: Philemon Kingdom, MD Primary Provider: Philemon Kingdom, MD Date of Office Visit: 11/05/2020  Subjective:   Donna Salazar (DOB: 07/22/1976) is a 45 y.o. female who [facility] in re-evaluation of the following:  HPI: Donna Salazar returns to this clinic in reevaluation of cough with an atopic phenotype.  I last saw her in this clinic during her initial evaluation of 08 October 2020.   She has resolved her cough.  She can exercise.  She has no need to use a short acting bronchodilator.  She continues to use Alvesco.  She is not using any therapy for reflux.  She has started a course of immunotherapy.  Allergies as of 11/05/2020      Reactions   Cefdinir Rash   Cephalosporins Rash   Penicillins Rash      Medication List      albuterol (2.5 MG/3ML) 0.083% nebulizer solution Commonly known as: PROVENTIL Take 3 mLs (2.5 mg total) by nebulization every 6 (six) hours as needed for wheezing or shortness of breath.   Auvi-Q 0.3 mg/0.3 mL Soaj injection Generic drug: EPINEPHrine Use as directed for life-threatening allergic reaction.   CALCIUM-VITAMIN D PO Take by mouth daily.   loratadine 10 MG tablet Commonly known as: CLARITIN Take 10 mg by mouth daily.   NASACORT AQ NA Place 1 spray into the nose daily.   ondansetron 4 MG disintegrating tablet Commonly known as: ZOFRAN-ODT Take 4 mg by mouth every 8 (eight) hours as needed for nausea.       Past Medical History:  Diagnosis Date  . Scoliosis (and kyphoscoliosis), idiopathic   . Vitamin D deficiency     Past Surgical History:  Procedure Laterality Date  . CHOLECYSTECTOMY  2005    Review of systems negative except as noted in HPI / PMHx or noted below:  Review of Systems  Constitutional: Negative.   HENT: Negative.   Eyes: Negative.   Respiratory:  Negative.   Cardiovascular: Negative.   Gastrointestinal: Negative.   Genitourinary: Negative.   Musculoskeletal: Negative.   Skin: Negative.   Neurological: Negative.   Endo/Heme/Allergies: Negative.   Psychiatric/Behavioral: Negative.      Objective:   Vitals:   11/05/20 1131  BP: 118/82  Pulse: 100  Resp: 20  SpO2: 96%          Physical Exam Constitutional:      Appearance: She is not diaphoretic.  HENT:     Head: Normocephalic.     Right Ear: Tympanic membrane, ear canal and external ear normal.     Left Ear: Tympanic membrane, ear canal and external ear normal.     Nose: Nose normal. No mucosal edema or rhinorrhea.     Mouth/Throat:     Mouth: Oropharynx is clear and moist and mucous membranes are normal.     Pharynx: Uvula midline. No oropharyngeal exudate.  Eyes:     Conjunctiva/sclera: Conjunctivae normal.  Neck:     Thyroid: No thyromegaly.     Trachea: Trachea normal. No tracheal tenderness or tracheal deviation.  Cardiovascular:     Rate and Rhythm: Normal rate and regular rhythm.     Heart sounds: Normal heart sounds, S1 normal and S2 normal. No murmur heard.   Pulmonary:     Effort: No respiratory distress.     Breath sounds: Normal breath sounds. No stridor. No wheezing  or rales.  Musculoskeletal:        General: No edema.  Lymphadenopathy:     Head:     Right side of head: No tonsillar adenopathy.     Left side of head: No tonsillar adenopathy.     Cervical: No cervical adenopathy.  Skin:    Findings: No erythema or rash.     Nails: There is no clubbing.  Neurological:     Mental Status: She is alert.     Diagnostics:   Results of a methacholine challenge test performed 07 October 2020 identified no hypersensitivity directed against methacholine administration without any evidence of bronchial hyperreactivity.  Assessment and Plan:   1. Allergic rhinitis, unspecified seasonality, unspecified trigger   2. Eosinophilic bronchitis      1.  Continue immunotherapy against dust mite, dog, pollens  2.  Taper Alvesco 160 -1 inhalation 1 time per day for 6 months, followed by 1 inhalation 3 times per week for 6 months, then discontinue  3.  If needed:   A.  Albuterol MDI 2 inhalations or nebulization every 4-6 hours  4.  Return to clinic in 1 year or earlier if problem  Donna Salazar has had a good response to the administration of Alvesco regarding her cough and at this point in time we will label her prolonged cough as eosinophilic bronchitis given response to treatment and a negative methacholine challenge test.  We will slowly taper her off Alvesco with instructions as noted above.  She will continue on immunotherapy directed against dust mite and dogs and pollens.  I will see her back in this clinic in 1 year or earlier if there is a problem.  Donna Schimke, MD Allergy / Immunology [facility]

## 2020-11-05 NOTE — Patient Instructions (Addendum)
  1.  Continue immunotherapy against dust mite, dog, pollens  2.  Taper Alvesco 160 -1 inhalation 1 time per day for 6 months, followed by 1 inhalation 3 times per week for 6 months, then discontinue  3.  If needed:   A.  Albuterol MDI 2 inhalations or nebulization every 4-6 hours  4.  Return to clinic in 1 year or earlier if problem

## 2020-11-12 ENCOUNTER — Ambulatory Visit (INDEPENDENT_AMBULATORY_CARE_PROVIDER_SITE_OTHER): Payer: 59 | Admitting: *Deleted

## 2020-11-12 DIAGNOSIS — J309 Allergic rhinitis, unspecified: Secondary | ICD-10-CM

## 2020-11-26 ENCOUNTER — Ambulatory Visit (INDEPENDENT_AMBULATORY_CARE_PROVIDER_SITE_OTHER): Payer: 59 | Admitting: *Deleted

## 2020-11-26 DIAGNOSIS — J309 Allergic rhinitis, unspecified: Secondary | ICD-10-CM | POA: Diagnosis not present

## 2020-12-11 ENCOUNTER — Ambulatory Visit (INDEPENDENT_AMBULATORY_CARE_PROVIDER_SITE_OTHER): Payer: 59 | Admitting: *Deleted

## 2020-12-11 DIAGNOSIS — J309 Allergic rhinitis, unspecified: Secondary | ICD-10-CM | POA: Diagnosis not present

## 2020-12-22 ENCOUNTER — Ambulatory Visit (INDEPENDENT_AMBULATORY_CARE_PROVIDER_SITE_OTHER): Payer: 59

## 2020-12-22 DIAGNOSIS — J309 Allergic rhinitis, unspecified: Secondary | ICD-10-CM

## 2020-12-28 ENCOUNTER — Ambulatory Visit (INDEPENDENT_AMBULATORY_CARE_PROVIDER_SITE_OTHER): Payer: 59 | Admitting: *Deleted

## 2020-12-28 DIAGNOSIS — J309 Allergic rhinitis, unspecified: Secondary | ICD-10-CM

## 2021-01-07 ENCOUNTER — Ambulatory Visit (INDEPENDENT_AMBULATORY_CARE_PROVIDER_SITE_OTHER): Payer: 59 | Admitting: *Deleted

## 2021-01-07 DIAGNOSIS — J309 Allergic rhinitis, unspecified: Secondary | ICD-10-CM

## 2021-01-12 ENCOUNTER — Telehealth: Payer: Self-pay | Admitting: Allergy and Immunology

## 2021-01-12 ENCOUNTER — Other Ambulatory Visit: Payer: Self-pay

## 2021-01-12 MED ORDER — BUDESONIDE 0.5 MG/2ML IN SUSP: 0.5000 mg | Freq: Two times a day (BID) | RESPIRATORY_TRACT | 5 refills | Status: DC

## 2021-01-12 NOTE — Telephone Encounter (Signed)
Refill for Pulmicort sent in to [facility] in [place]. Patient was informed

## 2021-01-12 NOTE — Telephone Encounter (Signed)
Patient called and needs to have Pulmicort nebulizer solution called into [facility] in [place] [phone]

## 2021-01-13 ENCOUNTER — Ambulatory Visit (INDEPENDENT_AMBULATORY_CARE_PROVIDER_SITE_OTHER): Payer: 59

## 2021-01-13 DIAGNOSIS — J309 Allergic rhinitis, unspecified: Secondary | ICD-10-CM

## 2021-01-19 ENCOUNTER — Ambulatory Visit (INDEPENDENT_AMBULATORY_CARE_PROVIDER_SITE_OTHER): Payer: 59

## 2021-01-19 DIAGNOSIS — J309 Allergic rhinitis, unspecified: Secondary | ICD-10-CM | POA: Diagnosis not present

## 2021-02-03 ENCOUNTER — Ambulatory Visit (INDEPENDENT_AMBULATORY_CARE_PROVIDER_SITE_OTHER): Payer: 59 | Admitting: *Deleted

## 2021-02-03 DIAGNOSIS — J309 Allergic rhinitis, unspecified: Secondary | ICD-10-CM

## 2021-02-11 ENCOUNTER — Ambulatory Visit (INDEPENDENT_AMBULATORY_CARE_PROVIDER_SITE_OTHER): Payer: 59

## 2021-02-11 DIAGNOSIS — J309 Allergic rhinitis, unspecified: Secondary | ICD-10-CM

## 2021-02-23 ENCOUNTER — Ambulatory Visit (INDEPENDENT_AMBULATORY_CARE_PROVIDER_SITE_OTHER): Payer: 59 | Admitting: *Deleted

## 2021-02-23 DIAGNOSIS — J309 Allergic rhinitis, unspecified: Secondary | ICD-10-CM

## 2021-03-04 ENCOUNTER — Telehealth: Payer: Self-pay | Admitting: *Deleted

## 2021-03-04 ENCOUNTER — Ambulatory Visit (INDEPENDENT_AMBULATORY_CARE_PROVIDER_SITE_OTHER): Payer: 59 | Admitting: *Deleted

## 2021-03-04 DIAGNOSIS — J309 Allergic rhinitis, unspecified: Secondary | ICD-10-CM | POA: Diagnosis not present

## 2021-03-04 MED ORDER — PREDNISONE 10 MG PO TABS: 10.0000 mg | ORAL_TABLET | Freq: Every day | ORAL | 0 refills | Status: DC

## 2021-03-04 NOTE — Telephone Encounter (Signed)
Donna Salazar calls stating that she has been experiencing pretty bad "coughing spells" for the last 2 months. She is using her Pulmicort nebulizer twice daily, sometimes three times when the coughing is really bad. She has also been taking Claritin RediTabs. She would like to know if oral Prednisone would help with her cough. Please advise.

## 2021-03-04 NOTE — Telephone Encounter (Signed)
Please inform Donna Salazar that we can give her a low-dose of prednisone at 10 mg daily for 10 days which hopefully will eliminate any low-grade inflammation in her airway that is contributing to her cough.  If she does not respond adequately to this then we need to think about doing some other form of treatment and evaluation.

## 2021-03-04 NOTE — Telephone Encounter (Signed)
Patient was informed and verbalized understanding. Patient would like prednisone prescription sent in to [facility] on [address]. Prescription has been sent in

## 2021-03-11 ENCOUNTER — Ambulatory Visit (INDEPENDENT_AMBULATORY_CARE_PROVIDER_SITE_OTHER): Payer: 59 | Admitting: *Deleted

## 2021-03-11 DIAGNOSIS — J309 Allergic rhinitis, unspecified: Secondary | ICD-10-CM | POA: Diagnosis not present

## 2021-03-18 ENCOUNTER — Ambulatory Visit (INDEPENDENT_AMBULATORY_CARE_PROVIDER_SITE_OTHER): Payer: 59

## 2021-03-18 DIAGNOSIS — J309 Allergic rhinitis, unspecified: Secondary | ICD-10-CM | POA: Diagnosis not present

## 2021-03-23 ENCOUNTER — Ambulatory Visit (INDEPENDENT_AMBULATORY_CARE_PROVIDER_SITE_OTHER): Payer: 59

## 2021-03-23 DIAGNOSIS — J309 Allergic rhinitis, unspecified: Secondary | ICD-10-CM

## 2021-03-31 ENCOUNTER — Other Ambulatory Visit: Payer: Self-pay

## 2021-03-31 ENCOUNTER — Ambulatory Visit (INDEPENDENT_AMBULATORY_CARE_PROVIDER_SITE_OTHER): Payer: 59

## 2021-03-31 ENCOUNTER — Ambulatory Visit (INDEPENDENT_AMBULATORY_CARE_PROVIDER_SITE_OTHER): Payer: 59 | Admitting: Sports Medicine

## 2021-03-31 ENCOUNTER — Other Ambulatory Visit: Payer: Self-pay | Admitting: Sports Medicine

## 2021-03-31 ENCOUNTER — Encounter: Payer: Self-pay | Admitting: Sports Medicine

## 2021-03-31 DIAGNOSIS — M216X2 Other acquired deformities of left foot: Secondary | ICD-10-CM

## 2021-03-31 DIAGNOSIS — M722 Plantar fascial fibromatosis: Secondary | ICD-10-CM | POA: Diagnosis not present

## 2021-03-31 DIAGNOSIS — J301 Allergic rhinitis due to pollen: Secondary | ICD-10-CM | POA: Insufficient documentation

## 2021-03-31 DIAGNOSIS — M79672 Pain in left foot: Secondary | ICD-10-CM | POA: Diagnosis not present

## 2021-03-31 DIAGNOSIS — J4530 Mild persistent asthma, uncomplicated: Secondary | ICD-10-CM | POA: Insufficient documentation

## 2021-03-31 DIAGNOSIS — M79671 Pain in right foot: Secondary | ICD-10-CM

## 2021-03-31 DIAGNOSIS — J3081 Allergic rhinitis due to animal (cat) (dog) hair and dander: Secondary | ICD-10-CM | POA: Insufficient documentation

## 2021-03-31 DIAGNOSIS — J309 Allergic rhinitis, unspecified: Secondary | ICD-10-CM | POA: Insufficient documentation

## 2021-03-31 DIAGNOSIS — K2100 Gastro-esophageal reflux disease with esophagitis, without bleeding: Secondary | ICD-10-CM | POA: Insufficient documentation

## 2021-03-31 MED ORDER — TRIAMCINOLONE ACETONIDE 10 MG/ML IJ SUSP
10.0000 mg | Freq: Once | INTRAMUSCULAR | Status: AC
  Administered 2021-03-31: 10 mg

## 2021-03-31 NOTE — Progress Notes (Signed)
Subjective: Donna Salazar is a 45 y.o. female patient presents to office with complaint of moderate heel pain on the left that radiates from the bottom to the back. Pain started after being more active.Patient admits to post static dyskinesia for 2 months in duration. Patient has treated this problem with motrin 600mg , stretching, icing with no complete relief. Prednisone and new shoes helped. Denies any other pedal complaints.   Patient Active Problem List   Diagnosis Date Noted  . Allergic rhinitis 03/31/2021  . Allergic rhinitis due to animal (cat) (dog) hair and dander 03/31/2021  . Allergic rhinitis due to pollen 03/31/2021  . Gastro-esophageal reflux disease with esophagitis, without bleeding 03/31/2021  . Mild persistent asthma, uncomplicated 03/31/2021  . Chronic cough 04/07/2020  . Family history of colon cancer 03/11/2014    Current Outpatient Medications on File Prior to Visit  Medication Sig Dispense Refill  . albuterol (PROVENTIL) (2.5 MG/3ML) 0.083% nebulizer solution Take 3 mLs (2.5 mg total) by nebulization every 6 (six) hours as needed for wheezing or shortness of breath. 75 mL 1  . AUVI-Q 0.3 MG/0.3ML SOAJ injection Use as directed for life-threatening allergic reaction. 1 each 3  . budesonide (PULMICORT) 0.5 MG/2ML nebulizer solution Take 2 mLs (0.5 mg total) by nebulization 2 (two) times daily. 120 mL 5  . CALCIUM-VITAMIN D PO Take by mouth daily.     No current facility-administered medications on file prior to visit.    Allergies  Allergen Reactions  . Cephalosporins Rash  . Penicillins Rash    Objective: Physical Exam General: The patient is alert and oriented x3 in no acute distress.  Dermatology: Skin is warm, dry and supple bilateral lower extremities. Nails 1-10 are normal. There is no erythema, edema, no eccymosis, no open lesions present. Integument is otherwise unremarkable.  Vascular: Dorsalis Pedis pulse and Posterior Tibial pulse are 2/4  bilateral. Capillary fill time is immediate to all digits.  Neurological: Grossly intact to light touch bilateral.   Musculoskeletal: Tenderness to palpation at the medial calcaneal tubercale and through the insertion of the plantar fascia on the left foot. No significant pain achilles insertion on left. No pain with tuning fork to calcaneus bilateral. No pain with calf compression bilateral. There is decreased Ankle joint range of motion bilateral. All other joints range of motion within normal limits bilateral. Strength 5/5 in all groups bilateral.   Gait: Unassisted, Antalgic avoid weight on left heel  Xray,Left foot:  Normal osseous mineralization. Joint spaces preserved. No fracture/dislocation/boney destruction. Calcaneal spur present with mild thickening of plantar fascia. No other soft tissue abnormalities or radiopaque foreign bodies.   Assessment and Plan: Problem List Items Addressed This Visit   None   Visit Diagnoses    Pain of left heel    -  Primary   Relevant Orders   DG Foot Complete Right   DG Foot Complete Left   Plantar fasciitis of left foot       Relevant Medications   triamcinolone acetonide (KENALOG) 10 MG/ML injection 10 mg (Start on 03/31/2021  2:00 PM)   Acquired equinus deformity of left foot          -Complete examination performed.  -Xrays reviewed -Discussed with patient in detail the condition of plantar fasciitis, how this occurs and general treatment options. Explained both conservative and surgical treatments.  -After oral consent and aseptic prep, injected a mixture containing 1 ml of 2%  plain lidocaine, 1 ml 0.5% plain marcaine, 0.5 ml of kenalog  10 and 0.5 ml of dexamethasone phosphate into left heel. Post-injection care discussed with patient.  -Recommended good supportive shoes and advised use of OTC insert of which she has -Advised patient to use night splint as directed that she already has -Explained and dispensed to patient daily stretching  exercises. -Recommend patient to ice affected area 1-2x daily. -Patient to return to office in 4 weeks for follow up or sooner if problems or questions arise.  Asencion Islam, DPM

## 2021-04-01 ENCOUNTER — Ambulatory Visit (INDEPENDENT_AMBULATORY_CARE_PROVIDER_SITE_OTHER): Payer: 59 | Admitting: *Deleted

## 2021-04-01 DIAGNOSIS — J309 Allergic rhinitis, unspecified: Secondary | ICD-10-CM

## 2021-04-05 ENCOUNTER — Ambulatory Visit (INDEPENDENT_AMBULATORY_CARE_PROVIDER_SITE_OTHER): Payer: 59 | Admitting: *Deleted

## 2021-04-05 DIAGNOSIS — J309 Allergic rhinitis, unspecified: Secondary | ICD-10-CM

## 2021-04-08 ENCOUNTER — Ambulatory Visit (INDEPENDENT_AMBULATORY_CARE_PROVIDER_SITE_OTHER): Payer: 59 | Admitting: *Deleted

## 2021-04-08 DIAGNOSIS — J309 Allergic rhinitis, unspecified: Secondary | ICD-10-CM | POA: Diagnosis not present

## 2021-04-15 ENCOUNTER — Emergency Department
Admission: EM | Admit: 2021-04-15 | Discharge: 2021-04-16 | Disposition: A | Discharge status: Home / Self Care | Payer: PRIVATE HEALTH INSURANCE | Attending: Emergency Medicine | Admitting: Emergency Medicine

## 2021-04-15 ENCOUNTER — Other Ambulatory Visit: Payer: Self-pay

## 2021-04-15 DIAGNOSIS — R42 Dizziness and giddiness: Secondary | ICD-10-CM

## 2021-04-15 DIAGNOSIS — R9431 Abnormal electrocardiogram [ECG] [EKG]: Secondary | ICD-10-CM

## 2021-04-15 DIAGNOSIS — Z79899 Other long term (current) drug therapy: Secondary | ICD-10-CM | POA: Insufficient documentation

## 2021-04-15 DIAGNOSIS — K920 Hematemesis: Secondary | ICD-10-CM | POA: Insufficient documentation

## 2021-04-15 LAB — PARTIAL THROMBOPLASTIN TIME: Partial Thromboplastin Time: 25 s (ref 22–35)

## 2021-04-15 LAB — COMPREHENSIVE METABOLIC PANEL
ALT (GPT): 17 U/L (ref 7–33)
AST (GOT): 20 U/L (ref 9–38)
Albumin: 3.8 g/dL (ref 3.5–5.2)
Alkaline Phosphatase (Total): 70 U/L (ref 34–121)
Anion Gap: 10 (ref 4–12)
Bilirubin (Total): 0.3 mg/dL (ref 0.2–1.3)
Calcium: 8.2 mg/dL — ABNORMAL LOW (ref 8.9–10.2)
Carbon Dioxide, Total: 22 meq/L (ref 22–32)
Chloride: 104 meq/L (ref 98–108)
Creatinine: 0.88 mg/dL (ref 0.38–1.02)
Glucose: 124 mg/dL (ref 62–125)
Potassium: 3.9 meq/L (ref 3.6–5.2)
Protein (Total): 6.3 g/dL (ref 6.0–8.2)
Sodium: 136 meq/L (ref 135–145)
Urea Nitrogen: 31 mg/dL — ABNORMAL HIGH (ref 8–21)
eGFR by CKD-EPI: >60 mL/min/{1.73_m2} (ref >59)

## 2021-04-15 LAB — CBC, DIFF
% Basophils: 1 %
% Eosinophils: 2 %
% Immature Granulocytes: 1 %
% Lymphocytes: 23 %
% Monocytes: 6 %
% Neutrophils: 67 %
% Nucleated RBC: 0 %
Absolute Eosinophil Count: 0.19 10*3/uL (ref 0.00–0.50)
Absolute Lymphocyte Count: 2.95 10*3/uL (ref 1.00–4.80)
Basophils: 0.06 10*3/uL (ref 0.00–0.20)
Hematocrit: 34 % — ABNORMAL LOW (ref 36.0–45.0)
Hemoglobin: 11.2 g/dL — ABNORMAL LOW (ref 11.5–15.5)
Immature Granulocytes: 0.07 10*3/uL — ABNORMAL HIGH (ref 0.00–0.05)
MCH: 30.7 pg (ref 27.3–33.6)
MCHC: 33.3 g/dL (ref 32.2–36.5)
MCV: 92 fL (ref 81–98)
Monocytes: 0.76 10*3/uL (ref 0.00–0.80)
Neutrophils: 8.65 10*3/uL — ABNORMAL HIGH (ref 1.80–7.00)
Nucleated RBC: 0 10*3/uL
Platelet Count: 320 10*3/uL (ref 150–400)
RBC: 3.65 10*6/uL — ABNORMAL LOW (ref 3.80–5.00)
RDW-CV: 13.3 % (ref 11.6–14.4)
WBC: 12.68 10*3/uL — ABNORMAL HIGH (ref 4.3–10.0)

## 2021-04-15 LAB — LIPASE: Lipase: 35 U/L (ref <70)

## 2021-04-15 LAB — MAGNESIUM: Magnesium: 1.9 mg/dL (ref 1.8–2.4)

## 2021-04-15 LAB — PROTHROMBIN TIME
Prothrombin INR: 1.1 (ref 0.8–1.3)
Prothrombin Time Patient: 14.1 s (ref 10.7–15.6)

## 2021-04-15 LAB — 1ST EXTRA GRAY TOP

## 2021-04-15 MED ORDER — ONDANSETRON HCL 4 MG/2ML IJ SOLN
8.0000 mg | Freq: Once | INTRAMUSCULAR | Status: DC
Start: 2021-04-15 — End: 2021-04-16

## 2021-04-15 MED ORDER — LACTATED RINGERS BOLUS
1000.0000 mL | Freq: Once | INTRAVENOUS | Status: AC
Start: 2021-04-15 — End: 2021-04-16
  Administered 2021-04-15: 1000 mL via INTRAVENOUS

## 2021-04-15 NOTE — ED Triage Notes (Addendum)
States vomited approx 5oz of blood just PTA, states was "pure blood, bright red." States has son not feeling well (GI sx). Also states has been taking motrin for plantar fasciitis and zofran for a few days of nausea (without vomiting) and abd bloating.     Denies medical history. Recent travel from [place].

## 2021-04-15 NOTE — ED Provider Notes (Signed)
CHIEF COMPLAINT   Chief Complaint   Patient presents with    Vomiting Blood            HISTORY OF PRESENT ILLNESS   HPI  The patient is a 45 year old female who presents after 1 episode of vomiting bright red blood this evening and feeling nauseated for the last few days.  She is visiting from [place].  Had been feeling nauseated but had not vomited for the last few days, also had mild abdominal bloating but no abdominal pain.  She thought that this was due to a virus because her son also has similar symptoms.  Today, she ate dinner and then afterward felt suddenly more nauseated and vomited once.  She felt slightly lightheaded after she vomited but quickly returned to normal.  She did not syncopized, fall, hit head.  She has never had anything like this before.  Denies dark stools.  She has had Planter fasciitis for the last 6 weeks and has been taking 600 mg of ibuprofen "multiple times a day."  Has also had steroid injection by podiatry for this.          PAST MEDICAL AND SURGICAL HISTORY       Planter fasciitis,   S/p cholecystectomy                                     MEDICATIONS AND ALLERGIES     OUTPATIENT MEDICATIONS:   Current Outpatient Medications   Medication Instructions    omeprazole (PRILOSEC) 40 mg, Oral, Daily empty stomach    ondansetron (ZOFRAN ODT) 4 mg, Oral, Every 8 hours PRN       ALLERGIES:   Cephalosporins and Penicillins          SOCIAL HISTORY           Lives in [place].  Denies significant alcohol, tobacco, drug use.           REVIEW OF SYSTEMS   Review of Systems  10/14 Review of Systems completed and negative except as stated above in the HPI (Systems reviewed: HENT, Eyes, Resp, CV, GI, GU, MSK, Skin, Neuro, Psych)            PHYSICAL EXAM   ED VITALS:  Vitals (Arrival)      T: 36.8 C (04/15/21 2101)  BP: 99/60 (04/15/21 2101)  HR: 86 (04/15/21 2101)  RR: 18 (04/15/21 2101)  SpO2: 98 % (04/15/21 2101) Room air   Vitals (Most recent in last 24 hrs)   T: 36.8 C  (04/15/21 2101)  BP: 101/61 (04/16/21 0030)  HR: 74 (04/16/21 0030)  RR: 16 (04/16/21 0030)  SpO2: 97 % (04/16/21 0030) Room air  T range: Temp  Min: 36.8 C  Max: 36.8 C  (no weight taken for this visit)     (no height taken for this visit)     There is no height or weight on file to calculate BMI.           Physical Exam  Vitals and nursing note reviewed.   Constitutional:       General: She is not in acute distress.     Appearance: Normal appearance. She is not diaphoretic.   HENT:      Head: Normocephalic and atraumatic.      Right Ear: External ear normal.      Left Ear: External ear normal.  Nose: Nose normal.      Mouth/Throat:      Mouth: Mucous membranes are moist.      Pharynx: Oropharynx is clear. No oropharyngeal exudate or posterior oropharyngeal erythema.   Eyes:      General: No scleral icterus.     Extraocular Movements: Extraocular movements intact.      Conjunctiva/sclera: Conjunctivae normal.      Pupils: Pupils are equal, round, and reactive to light.   Neck:      Musculoskeletal: Normal range of motion and neck supple.   Cardiovascular:      Rate and Rhythm: Normal rate and regular rhythm.      Pulses: Normal pulses.      Heart sounds: Normal heart sounds.   Pulmonary:      Effort: Pulmonary effort is normal. No respiratory distress.      Breath sounds: Normal breath sounds. No wheezing.   Abdominal:      General: There is no distension.      Palpations: Abdomen is soft. There is no mass.      Tenderness: There is no abdominal tenderness. There is no guarding or rebound.   Musculoskeletal:         General: No swelling, deformity or signs of injury. Normal range of motion.      Cervical back: Normal range of motion and neck supple.      Right lower leg: No edema.      Left lower leg: No edema.   Skin:     General: Skin is warm and dry.      Capillary Refill: Capillary refill takes less than 2 seconds.   Neurological:      General: No focal deficit present.      Mental Status: She is alert  and oriented to person, place, and time.      Cranial Nerves: No cranial nerve deficit.      Sensory: No sensory deficit.      Motor: No weakness.      Gait: Gait normal.   Psychiatric:         Mood and Affect: Mood normal.         Behavior: Behavior normal.         Thought Content: Thought content normal.             LABORATORY:   Labs Reviewed   CBC, DIFF - Abnormal       Result Value    WBC 12.68 (*)     RBC 3.65 (*)     Hemoglobin 11.2 (*)     Hematocrit 34 (*)     MCV 92      MCH 30.7      MCHC 33.3      Platelet Count 320      RDW-CV 13.3      % Neutrophils 67      % Lymphocytes 23      % Monocytes 6      % Eosinophils 2      % Basophils 1      % Immature Granulocytes 1      Neutrophils 8.65 (*)     Absolute Lymphocyte Count 2.95      Monocytes 0.76      Absolute Eosinophil Count 0.19      Basophils 0.06      Immature Granulocytes 0.07 (*)     Nucleated RBC 0.00      % Nucleated RBC 0  COMPREHENSIVE METABOLIC PANEL - Abnormal    Sodium 136      Potassium 3.9      Chloride 104      Carbon Dioxide, Total 22      Anion Gap 10      Glucose 124      Urea Nitrogen 31 (*)     Creatinine 0.88      Protein (Total) 6.3      Albumin 3.8      Bilirubin (Total) 0.3      Calcium 8.2 (*)     AST (GOT) 20      Alkaline Phosphatase (Total) 70      ALT (GPT) 17      eGFR by CKD-EPI >60     PROTHROMBIN TIME    Prothrombin Time Patient 14.1      Prothrombin INR 1.1     PARTIAL THROMBOPLASTIN TIME    Partial Thromboplastin Time 25      Partial Thromboplastin X Mean        Value: To calculate the PTT X Mean divide PTT value by 29.   MAGNESIUM    Magnesium 1.9     LIPASE    Lipase 35     PREGNANCY (HCG), SERUM, QUANT    Pregnancy (HCG), SRM <1     1ST EXTRA GRAY TOP    1st Extra [employer] Rx Additional collection tube           IMAGING:     ED Wet Read -   No orders to display       Radiology Final Result -   No image results found.                ED COURSE/MEDICAL DECISION MAKING   The patient is a 45 year old female who presents  after 1 episode of vomiting bright red blood this evening and feeling nauseated for the last few days.  Upon arrival to the ED she is hemodynamically stable and having mild nausea.  She denies any bright red blood per rectum, melena, given her bright red hematemesis this is likely upper GI bleeding.  Patient has no history of liver disease that would cause varices.  She has been taking large amounts of ibuprofen for 6 weeks, this is most likely a gastric ulcer that is bleeding.  She was evaluated with CBC, CMP, lipase.  She was given ondansetron for nausea.  Labs show no sign of dangerous anemia.  Per patient report hemoglobin is slightly below her baseline.  No other significant abnormalities.  She did not have any further episodes of vomiting while in the ED.  She felt somewhat lightheaded in the ED, was given 1 L of IV fluids and felt much improved.  She was prescribed omeprazole for gastritis and will stop taking ibuprofen.  She agreed with plan for PCP follow-up as soon as she returns home.    Based on the evaluation today, it is my clinical judgment that the patient can be safely managed as an outpatient with PCP follow-up. Patient was asked to return to the ED immediately for any new or concerning symptoms, or for worsening of existing symptoms. Patient was in agreement, endorsed understanding, and questions were answered.                            CLINICAL IMPRESSION/DISPOSITION   Clinical Impressions:   [K92.0] Hematemesis with nausea  Disposition: Discharge         [place]            ADDITIONAL INFORMATION REVIEWED  - ATTENDING Francis Dowse, MD  Resident  04/16/21 [phone]

## 2021-04-16 ENCOUNTER — Other Ambulatory Visit (HOSPITAL_BASED_OUTPATIENT_CLINIC_OR_DEPARTMENT_OTHER): Payer: Self-pay

## 2021-04-16 ENCOUNTER — Telehealth: Payer: Self-pay | Admitting: *Deleted

## 2021-04-16 ENCOUNTER — Other Ambulatory Visit: Payer: Self-pay | Admitting: Sports Medicine

## 2021-04-16 LAB — PREGNANCY (HCG), SERUM, QUANT: Pregnancy (HCG), SRM: <1 m[IU]/mL (ref <6)

## 2021-04-16 MED ORDER — PREDNISONE 10 MG (21) PO TBPK
ORAL_TABLET | ORAL | 0 refills | Status: DC
Start: 2021-04-16 — End: 2021-08-12

## 2021-04-16 MED ORDER — OMEPRAZOLE 40 MG OR CPDR
40.0000 mg | DELAYED_RELEASE_CAPSULE | Freq: Every day | ORAL | 0 refills | Status: AC
Start: 2021-04-16 — End: 2021-05-16
  Filled 2021-04-16: qty 30, 30d supply, fill #0

## 2021-04-16 MED ORDER — ONDANSETRON 4 MG OR TBDP
4.0000 mg | ORAL_TABLET | Freq: Three times a day (TID) | ORAL | 0 refills | Status: AC | PRN
Start: 2021-04-16 — End: ?
  Filled 2021-04-16: qty 20, 7d supply, fill #0

## 2021-04-16 NOTE — Discharge Instructions (Signed)
If you have persistent hematemesis, lightheadedness, passout, or any other concerning symptoms please return to the emergency department.

## 2021-04-16 NOTE — ED Notes (Signed)
Discharge instructions and follow up care reviewed. Medications discussed. Questions encouraged and answered. Pt A&Ox4, ambulatory, with stable VS upon discharge.       Samella Parr, RN  04/16/21 [phone]

## 2021-04-16 NOTE — Telephone Encounter (Signed)
-----   Message from Asencion Islam, [place] sent at 04/16/2021  1:06 PM EDT ----- Sent prednisone Dosepak to her pharmacy for her foot pain. ----- Message ----- From: Lanney Gins, PMAC Sent: 04/16/2021  11:32 AM EDT To: Asencion Islam, DPM  Patient called and stated that she saw you couple of weeks ago and is on vacation and her foot is hurting some and wanted to see if you could call her in some medicine and it is the [facility] in [place] and the address is [address] and the pharmacy number is [phone]. Misty Stanley

## 2021-04-16 NOTE — Progress Notes (Signed)
Sent prednisone Dosepak for foot pain.

## 2021-04-16 NOTE — Telephone Encounter (Signed)
Called and left a message for the patient that Dr Marylene Land sent over the RX to the pharmacy [facility] in [place]. Misty Stanley

## 2021-04-19 LAB — EKG 12 LEAD
Atrial Rate: 79 {beats}/min
Diagnosis: NORMAL
P Axis: 9 degrees
P-R Interval: 132 ms
Q-T Interval: 368 ms
QRS Duration: 88 ms
QTC Calculation: 421 ms
R Axis: 49 degrees
T Axis: 24 degrees
Ventricular Rate: 79 {beats}/min

## 2021-05-07 ENCOUNTER — Other Ambulatory Visit: Payer: Self-pay

## 2021-05-07 ENCOUNTER — Encounter: Payer: Self-pay | Admitting: Sports Medicine

## 2021-05-07 ENCOUNTER — Ambulatory Visit (INDEPENDENT_AMBULATORY_CARE_PROVIDER_SITE_OTHER): Payer: 59 | Admitting: Sports Medicine

## 2021-05-07 DIAGNOSIS — M79672 Pain in left foot: Secondary | ICD-10-CM | POA: Diagnosis not present

## 2021-05-07 DIAGNOSIS — M722 Plantar fascial fibromatosis: Secondary | ICD-10-CM

## 2021-05-07 DIAGNOSIS — M216X2 Other acquired deformities of left foot: Secondary | ICD-10-CM

## 2021-05-07 MED ORDER — TRIAMCINOLONE ACETONIDE 10 MG/ML IJ SUSP
10.0000 mg | Freq: Once | INTRAMUSCULAR | Status: AC
  Administered 2021-05-07: 10 mg

## 2021-05-07 NOTE — Progress Notes (Signed)
Subjective: Donna Salazar is a 45 y.o. female patient returns to office for follow-up evaluation of left heel pain.  Patient reports that the pain is better but not 100% resolved.  Patient reports that she had to stop taking any NSAIDs because she started vomiting blood while in [place] because of her history of gastric ulcer.  Patient reports that she just got back from [place] yesterday and did a lot of hiking and had some significant pain but icing really helped. Denies any other pedal complaints.   Patient Active Problem List   Diagnosis Date Noted   Allergic rhinitis 03/31/2021   Allergic rhinitis due to animal (cat) (dog) hair and dander 03/31/2021   Allergic rhinitis due to pollen 03/31/2021   Gastro-esophageal reflux disease with esophagitis, without bleeding 03/31/2021   Mild persistent asthma, uncomplicated 03/31/2021   Chronic cough 04/07/2020   Family history of colon cancer 03/11/2014    Current Outpatient Medications on File Prior to Visit  Medication Sig Dispense Refill   albuterol (PROVENTIL) (2.5 MG/3ML) 0.083% nebulizer solution Take 3 mLs (2.5 mg total) by nebulization every 6 (six) hours as needed for wheezing or shortness of breath. 75 mL 1   AUVI-Q 0.3 MG/0.3ML SOAJ injection Use as directed for life-threatening allergic reaction. 1 each 3   budesonide (PULMICORT) 0.5 MG/2ML nebulizer solution Take 2 mLs (0.5 mg total) by nebulization 2 (two) times daily. 120 mL 5   predniSONE (STERAPRED UNI-PAK 21 TAB) 10 MG (21) TBPK tablet Take as directed 21 tablet 0   No current facility-administered medications on file prior to visit.    Allergies  Allergen Reactions   Cephalosporins Rash   Penicillins Rash    Objective: Physical Exam General: The patient is alert and oriented x3 in no acute distress.  Dermatology: Skin is warm, dry and supple bilateral lower extremities. Nails 1-10 are normal. There is no erythema, edema, no eccymosis, no open lesions present.  Integument is otherwise unremarkable.  Vascular: Dorsalis Pedis pulse and Posterior Tibial pulse are 2/4 bilateral. Capillary fill time is immediate to all digits.  Neurological: Grossly intact to light touch bilateral.   Musculoskeletal: Continued tenderness to palpation at the medial calcaneal tubercale and through the insertion of the plantar fascia on the left foot. No significant pain achilles insertion on left. No pain with tuning fork to calcaneus bilateral. No pain with calf compression bilateral. There is decreased Ankle joint range of motion bilateral. All other joints range of motion within normal limits bilateral. Strength 5/5 in all groups bilateral.    Assessment and Plan: Problem List Items Addressed This Visit   None Visit Diagnoses     Pain of left heel    -  Primary   Plantar fasciitis of left foot       Relevant Medications   triamcinolone acetonide (KENALOG) 10 MG/ML injection 10 mg (Start on 05/07/2021  7:45 PM)   Acquired equinus deformity of left foot           -Complete examination performed.  -Re-Discussed with patient in detail the condition of plantar fasciitis, how this occurs and general treatment options. Explained both conservative and surgical treatments.  -After oral consent and aseptic prep, injected a mixture containing 1 ml of 2%  plain lidocaine, 1 ml 0.5% plain marcaine, 0.5 ml of kenalog 10 and 0.5 ml of dexamethasone phosphate into left heel. This is injection #2 to the area. Post-injection care discussed with patient.  -Recommended continue with good supportive shoes and advised  use of OTC insert of which she has -Advised patient to continue to use night splint as directed that she already has -Explained and dispensed to patient daily stretching exercises. -Recommend patient to continue to  ice affected area 1-2x daily. -Patient to return to office in 3-4 weeks for follow up or sooner if problems or questions arise.  Asencion Islam, DPM

## 2021-06-04 ENCOUNTER — Ambulatory Visit: Payer: 59 | Admitting: Sports Medicine

## 2021-07-09 ENCOUNTER — Encounter: Payer: Self-pay | Admitting: Gastroenterology

## 2021-08-10 ENCOUNTER — Ambulatory Visit: Payer: 59 | Admitting: Gastroenterology

## 2021-08-12 ENCOUNTER — Ambulatory Visit (INDEPENDENT_AMBULATORY_CARE_PROVIDER_SITE_OTHER): Payer: 59 | Admitting: Gastroenterology

## 2021-08-12 ENCOUNTER — Other Ambulatory Visit (INDEPENDENT_AMBULATORY_CARE_PROVIDER_SITE_OTHER): Payer: 59

## 2021-08-12 ENCOUNTER — Other Ambulatory Visit: Payer: Self-pay

## 2021-08-12 ENCOUNTER — Encounter: Payer: Self-pay | Admitting: Gastroenterology

## 2021-08-12 VITALS — BP 118/82 | HR 91 | Ht 67.0 in | Wt 198.4 lb

## 2021-08-12 DIAGNOSIS — D649 Anemia, unspecified: Secondary | ICD-10-CM | POA: Diagnosis not present

## 2021-08-12 DIAGNOSIS — Z800 Family history of malignant neoplasm of digestive organs: Secondary | ICD-10-CM

## 2021-08-12 DIAGNOSIS — K920 Hematemesis: Secondary | ICD-10-CM | POA: Diagnosis not present

## 2021-08-12 LAB — IBC + FERRITIN
Ferritin: 4.9 ng/mL — ABNORMAL LOW (ref 10.0–291.0)
Iron: 39 ug/dL — ABNORMAL LOW (ref 42–145)
Saturation Ratios: 6.8 % — ABNORMAL LOW (ref 20.0–50.0)
TIBC: 575.4 ug/dL — ABNORMAL HIGH (ref 250.0–450.0)
Transferrin: 411 mg/dL — ABNORMAL HIGH (ref 212.0–360.0)

## 2021-08-12 LAB — CBC WITH DIFFERENTIAL/PLATELET
Basophils Absolute: 0.1 K/uL (ref 0.0–0.1)
Basophils Relative: 0.9 % (ref 0.0–3.0)
Eosinophils Absolute: 0.1 K/uL (ref 0.0–0.7)
Eosinophils Relative: 1.1 % (ref 0.0–5.0)
HCT: 34 % — ABNORMAL LOW (ref 36.0–46.0)
Hemoglobin: 10.8 g/dL — ABNORMAL LOW (ref 12.0–15.0)
Lymphocytes Relative: 20 % (ref 12.0–46.0)
Lymphs Abs: 1.6 K/uL (ref 0.7–4.0)
MCHC: 31.6 g/dL (ref 30.0–36.0)
MCV: 71.5 fl — ABNORMAL LOW (ref 78.0–100.0)
Monocytes Absolute: 0.5 K/uL (ref 0.1–1.0)
Monocytes Relative: 5.8 % (ref 3.0–12.0)
Neutro Abs: 5.9 K/uL (ref 1.4–7.7)
Neutrophils Relative %: 72.2 % (ref 43.0–77.0)
Platelets: 491 K/uL — ABNORMAL HIGH (ref 150.0–400.0)
RBC: 4.76 Mil/uL (ref 3.87–5.11)
RDW: 18.8 % — ABNORMAL HIGH (ref 11.5–15.5)
WBC: 8.2 K/uL (ref 4.0–10.5)

## 2021-08-12 LAB — VITAMIN B12: Vitamin B-12: 397 pg/mL (ref 211–911)

## 2021-08-12 LAB — FOLATE: Folate: >23.4 ng/mL (ref >5.9)

## 2021-08-12 MED ORDER — FERROUS SULFATE 325 (65 FE) MG PO TABS: 325.0000 mg | ORAL_TABLET | Freq: Two times a day (BID) | ORAL | 2 refills | Status: DC

## 2021-08-12 NOTE — Patient Instructions (Signed)
Your provider has requested that you go to the basement level for lab work before leaving today. Press "B" on the elevator. The lab is located at the first door on the left as you exit the elevator.  You have been scheduled for an endoscopy and colonoscopy. Please follow the written instructions given to you at your visit today. Please pick up your prep supplies at the pharmacy within the next 1-3 days. If you use inhalers (even only as needed), please bring them with you on the day of your procedure.  Due to recent changes in healthcare laws, you may see the results of your imaging and laboratory studies on MyChart before your provider has had a chance to review them.  We understand that in some cases there may be results that are confusing or concerning to you. Not all laboratory results come back in the same time frame and the provider may be waiting for multiple results in order to interpret others.  Please give us 48 hours in order for your provider to thoroughly review all the results before contacting the office for clarification of your results.   The [facility] providers would like to encourage you to use MYCHART to communicate with providers for non-urgent requests or questions.  Due to long hold times on the telephone, sending your provider a message by MYCHART may be a faster and more efficient way to get a response.  Please allow 48 business hours for a response.  Please remember that this is for non-urgent requests.   Thank you for choosing me and [facility].  Malcolm T. Stark, Jr., MD., FACG  

## 2021-08-12 NOTE — Progress Notes (Signed)
History of Present Illness: This is a 45 year old female referred by Philemon Kingdom, MD for the evaluation of a family history of colon cancer and hematemesis.  She is a GI NP.  She was evaluated at an ED in [place] state for 1 episode of hematemesis in June.  After evaluation in the ED she had melena for a few days which resolved. She has had no gastrointestinal symptoms since then.  She was taking ibuprofen regularly for several weeks prior to the episode of hematemesis for plantar fasciitis.  Ibuprofen was stopped. Omeprazole qd was started.  She relates a recent hemoglobin was 10.6.  Colonoscopy in June 2015 was normal. Denies weight loss, abdominal pain, constipation, diarrhea, change in stool caliber, melena, hematochezia, dysphagia, reflux symptoms, chest pain.    Allergies  Allergen Reactions   Cephalosporins Rash   Penicillins Rash   Outpatient Medications Prior to Visit  Medication Sig Dispense Refill   albuterol (PROVENTIL) (2.5 MG/3ML) 0.083% nebulizer solution Take 3 mLs (2.5 mg total) by nebulization every 6 (six) hours as needed for wheezing or shortness of breath. 75 mL 1   AUVI-Q 0.3 MG/0.3ML SOAJ injection Use as directed for life-threatening allergic reaction. 1 each 3   cimetidine (TAGAMET) 200 MG tablet Take 200 mg by mouth daily.     levocetirizine (XYZAL) 5 MG tablet 5 mg daily.     Multiple Vitamins-Minerals (MULTIVITAMIN WOMEN PO) Take 1 mg by mouth daily.     omeprazole (PRILOSEC) 40 MG capsule Take 40 mg by mouth daily.     VITAMIN D PO Take 1 mg by mouth daily.     budesonide (PULMICORT) 0.5 MG/2ML nebulizer solution Take 2 mLs (0.5 mg total) by nebulization 2 (two) times daily. 120 mL 5   predniSONE (STERAPRED UNI-PAK 21 TAB) 10 MG (21) TBPK tablet Take as directed 21 tablet 0   No facility-administered medications prior to visit.   Past Medical History:  Diagnosis Date   Scoliosis (and kyphoscoliosis), idiopathic    Vitamin D deficiency    Past  Surgical History:  Procedure Laterality Date   CHOLECYSTECTOMY  10/25/2003   umbilical surgery  2016   Social History   Socioeconomic History   Marital status: Married    Spouse name: Not on file   Number of children: 1   Years of education: Not on file   Highest education level: Not on file  Occupational History   Not on file  Tobacco Use   Smoking status: Never   Smokeless tobacco: Never  Vaping Use   Vaping Use: Never used  Substance and Sexual Activity   Alcohol use: No   Drug use: No   Sexual activity: Not on file  Other Topics Concern   Not on file  Social History Narrative   Not on file   Social Determinants of Health   Financial Resource Strain: Not on file  Food Insecurity: Not on file  Transportation Needs: Not on file  Physical Activity: Not on file  Stress: Not on file  Social Connections: Not on file   Family History  Problem Relation Age of Onset   Colon cancer Father 26       Review of Systems: Pertinent positive and negative review of systems were noted in the above HPI section. All other review of systems were otherwise negative.   Physical Exam: General: Well developed, well nourished, no acute distress Head: Normocephalic and atraumatic Eyes: Sclerae anicteric, EOMI Ears: Normal auditory acuity Mouth:  Not examined, mask on during Covid-19 pandemic Neck: Supple, no masses or thyromegaly Lungs: Clear throughout to auscultation Heart: Regular rate and rhythm; no murmurs, rubs or bruits Abdomen: Soft, non tender and non distended. No masses, hepatosplenomegaly or hernias noted. Normal Bowel sounds Rectal: Deferred to colonoscopy  Musculoskeletal: Symmetrical with no gross deformities  Skin: No lesions on visible extremities Pulses:  Normal pulses noted Extremities: No clubbing, cyanosis, edema or deformities noted Neurological: Alert oriented x 4, grossly nonfocal Cervical Nodes:  No significant cervical adenopathy Inguinal Nodes: No  significant inguinal adenopathy Psychological:  Alert and cooperative. Normal mood and affect   Assessment and Recommendations:  Family history of colon cancer, father at age 42. Schedule colonoscopy. The risks (including bleeding, perforation, infection, missed lesions, medication reactions and possible hospitalization or surgery if complications occur), benefits, and alternatives to colonoscopy with possible biopsy and possible polypectomy were discussed with the patient and they consent to proceed.    Hematemesis in June. Suspected ulcer, gastritis or MW tear.  Continue omeprazole 40 mg daily for now.  Minimize or avoid NSAIDs.  Schedule EGD at time of colonoscopy. The risks (including bleeding, perforation, infection, missed lesions, medication reactions and possible hospitalization or surgery if complications occur), benefits, and alternatives to endoscopy with possible biopsy and possible dilation were discussed with the patient and they consent to proceed.   Anemia, Hgb=10.6. CBC, Fe, TIBC, ferritin, B12, folate today.    cc: Philemon Kingdom, MD [address] [place],  [place] [address]

## 2021-08-20 ENCOUNTER — Telehealth: Payer: Self-pay | Admitting: Gastroenterology

## 2021-08-20 MED ORDER — ONDANSETRON 4 MG PO TBDP: 4.0000 mg | ORAL_TABLET | Freq: Four times a day (QID) | ORAL | 5 refills | Status: DC | PRN

## 2021-08-20 NOTE — Telephone Encounter (Signed)
Patient states she has been taking her ferrous sulfate 325 mg twice daily with meals and it is making her nauseous. Patient is requesting we send in zofran to help with nausea. Please advise Dr. Adela Lank, you are DOD.

## 2021-08-20 NOTE — Telephone Encounter (Signed)
Yes can give Zofran 4 mg ODT every 6 hours as needed.  She can also try reducing her iron supplement to once daily if twice daily is too difficult on her stomach.  Thanks

## 2021-08-20 NOTE — Telephone Encounter (Signed)
Informed patient of Dr. Lanetta Inch recommendations. Prescription for Zofran sent to patient's pharmacy. Patient verbalized understanding.

## 2021-08-20 NOTE — Telephone Encounter (Signed)
Inbound call from patient. States the iron pills are making her nausea. Is requesting zofran prescription.

## 2021-09-14 ENCOUNTER — Encounter: Payer: Self-pay | Admitting: Gastroenterology

## 2021-09-22 ENCOUNTER — Encounter: Payer: Self-pay | Admitting: Gastroenterology

## 2021-09-22 ENCOUNTER — Other Ambulatory Visit: Payer: Self-pay

## 2021-09-22 ENCOUNTER — Ambulatory Visit (AMBULATORY_SURGERY_CENTER): Payer: 59 | Admitting: Gastroenterology

## 2021-09-22 VITALS — BP 101/68 | HR 63 | Temp 97.5°F | Resp 13 | Ht 67.0 in | Wt 198.0 lb

## 2021-09-22 DIAGNOSIS — K640 First degree hemorrhoids: Secondary | ICD-10-CM

## 2021-09-22 DIAGNOSIS — Z800 Family history of malignant neoplasm of digestive organs: Secondary | ICD-10-CM | POA: Diagnosis not present

## 2021-09-22 DIAGNOSIS — K920 Hematemesis: Secondary | ICD-10-CM | POA: Diagnosis not present

## 2021-09-22 DIAGNOSIS — D509 Iron deficiency anemia, unspecified: Secondary | ICD-10-CM | POA: Diagnosis not present

## 2021-09-22 DIAGNOSIS — K3189 Other diseases of stomach and duodenum: Secondary | ICD-10-CM | POA: Diagnosis not present

## 2021-09-22 MED ORDER — SODIUM CHLORIDE 0.9 % IV SOLN: 500.0000 mL | Freq: Once | INTRAVENOUS | Status: DC

## 2021-09-22 NOTE — Patient Instructions (Signed)

## 2021-09-22 NOTE — Progress Notes (Signed)
Report to PACU, RN, vss, BBS= Clear.  

## 2021-09-22 NOTE — Progress Notes (Signed)
Pt's states no medical or surgical changes since previsit or office visit. VS assessed by N.C ?

## 2021-09-22 NOTE — Progress Notes (Signed)
Called to room to assist during endoscopic procedure.  Patient ID and intended procedure confirmed with present staff. Received instructions for my participation in the procedure from the performing physician.  

## 2021-09-22 NOTE — Op Note (Signed)
[facility] Patient Name: Donna Salazar Procedure Date: 09/22/2021 2:04 PM MRN: [id number] Endoscopist: Meryl Dare , MD Age: 45 Referring MD:  Date of Birth: Dec 31, 1975 Gender: Female Account #: [id number] Procedure:                Colonoscopy Indications:              Screening in patient at increased risk: Family                            history of 1st-degree relative with colorectal                            cancer before age 83 years, Iron deficiency anemia. Medicines:                Monitored Anesthesia Care Procedure:                Pre-Anesthesia Assessment:                           - Prior to the procedure, a History and Physical                            was performed, and patient medications and                            allergies were reviewed. The patient's tolerance of                            previous anesthesia was also reviewed. The risks                            and benefits of the procedure and the sedation                            options and risks were discussed with the patient.                            All questions were answered, and informed consent                            was obtained. Prior Anticoagulants: The patient has                            taken no previous anticoagulant or antiplatelet                            agents. ASA Grade Assessment: II - A patient with                            mild systemic disease. After reviewing the risks                            and benefits, the patient was deemed in  satisfactory condition to undergo the procedure.                           After obtaining informed consent, the colonoscope                            was passed under direct vision. Throughout the                            procedure, the patient's blood pressure, pulse, and                            oxygen saturations were monitored continuously. The                             Olympus CF-HQ190L ([id number]) Colonoscope was                            introduced through the anus and advanced to the the                            cecum, identified by appendiceal orifice and                            ileocecal valve. The ileocecal valve, appendiceal                            orifice, and rectum were photographed. The quality                            of the bowel preparation was good. The colonoscopy                            was performed without difficulty. The patient                            tolerated the procedure well. Scope In: 2:13:37 PM Scope Out: 2:34:41 PM Scope Withdrawal Time: 0 hours 17 minutes 42 seconds  Total Procedure Duration: 0 hours 21 minutes 4 seconds  Findings:                 The perianal and digital rectal examinations were                            normal.                           Internal hemorrhoids were found during                            retroflexion. The hemorrhoids were small and Grade                            I (internal hemorrhoids that do not prolapse).  The exam was otherwise without abnormality on                            direct and retroflexion views. Complications:            No immediate complications. Estimated blood loss:                            None. Estimated Blood Loss:     Estimated blood loss: none. Impression:               - Internal hemorrhoids.                           - The examination was otherwise normal on direct                            and retroflexion views.                           - No specimens collected. Recommendation:           - Repeat colonoscopy in 5 years for screening                            purposes.                           - Patient has a contact number available for                            emergencies. The signs and symptoms of potential                            delayed complications were discussed with the                             patient. Return to normal activities tomorrow.                            Written discharge instructions were provided to the                            patient.                           - Resume previous diet.                           - Continue present medications. Meryl Dare, MD 09/22/2021 2:48:56 PM This report has been signed electronically.

## 2021-09-22 NOTE — Progress Notes (Signed)
History & Physical  Primary Care Physician:  Philemon Kingdom, MD Primary Gastroenterologist: Claudette Head, MD  CHIEF COMPLAINT:  Iron deficiency anemia, history of hematemesis and melena, family history of colon cancer first-degree relative  HPI: Donna Salazar is a 45 y.o. female with iron deficiency anemia, history of hematemesis and melena, family history of colon cancer, first-degree relative.  For colonoscopy and EGD.  here for colonoscopy.    Past Medical History:  Diagnosis Date   Scoliosis (and kyphoscoliosis), idiopathic    Vitamin D deficiency     Past Surgical History:  Procedure Laterality Date   CHOLECYSTECTOMY  10/25/2003   umbilical surgery  2016    Prior to Admission medications   Medication Sig Start Date End Date Taking? Authorizing Provider  albuterol (PROVENTIL) (2.5 MG/3ML) 0.083% nebulizer solution Take 3 mLs (2.5 mg total) by nebulization every 6 (six) hours as needed for wheezing or shortness of breath. 10/08/20  Yes Kozlow, Alvira Philips, MD  budesonide (PULMICORT) 0.5 MG/2ML nebulizer solution Take 2 mLs (0.5 mg total) by nebulization 2 (two) times daily. 01/12/21 09/22/21 Yes Kozlow, Alvira Philips, MD  cimetidine (TAGAMET) 200 MG tablet Take 200 mg by mouth daily.   Yes [provider]  ferrous sulfate 325 (65 FE) MG tablet Take 1 tablet (325 mg total) by mouth 2 (two) times daily with a meal. 08/12/21  Yes Meryl Dare, MD  Multiple Vitamins-Minerals (MULTIVITAMIN WOMEN PO) Take 1 mg by mouth daily.   Yes [provider]  omeprazole (PRILOSEC) 40 MG capsule Take 40 mg by mouth daily. 04/16/21  Yes [provider]  ondansetron (ZOFRAN ODT) 4 MG disintegrating tablet Take 1 tablet (4 mg total) by mouth every 6 (six) hours as needed for nausea or vomiting. 08/20/21  Yes Armbruster, Willaim Rayas, MD  VITAMIN D PO Take 1 mg by mouth daily.   Yes [provider]  AUVI-Q 0.3 MG/0.3ML SOAJ injection Use as directed for  life-threatening allergic reaction. Patient not taking: Reported on 09/22/2021 10/28/20   Jessica Priest, MD    Current Outpatient Medications  Medication Sig Dispense Refill   albuterol (PROVENTIL) (2.5 MG/3ML) 0.083% nebulizer solution Take 3 mLs (2.5 mg total) by nebulization every 6 (six) hours as needed for wheezing or shortness of breath. 75 mL 1   budesonide (PULMICORT) 0.5 MG/2ML nebulizer solution Take 2 mLs (0.5 mg total) by nebulization 2 (two) times daily. 120 mL 5   cimetidine (TAGAMET) 200 MG tablet Take 200 mg by mouth daily.     ferrous sulfate 325 (65 FE) MG tablet Take 1 tablet (325 mg total) by mouth 2 (two) times daily with a meal. 60 tablet 2   Multiple Vitamins-Minerals (MULTIVITAMIN WOMEN PO) Take 1 mg by mouth daily.     omeprazole (PRILOSEC) 40 MG capsule Take 40 mg by mouth daily.     ondansetron (ZOFRAN ODT) 4 MG disintegrating tablet Take 1 tablet (4 mg total) by mouth every 6 (six) hours as needed for nausea or vomiting. 120 tablet 5   VITAMIN D PO Take 1 mg by mouth daily.     AUVI-Q 0.3 MG/0.3ML SOAJ injection Use as directed for life-threatening allergic reaction. (Patient not taking: Reported on 09/22/2021) 1 each 3   Current Facility-Administered Medications  Medication Dose Route Frequency Provider Last Rate Last Admin   0.9 %  sodium chloride infusion  500 mL Intravenous Once Meryl Dare, MD        Allergies as of 09/22/2021 -  Review Complete 09/22/2021  Allergen Reaction Noted   Cephalosporins Rash 03/10/2014   Penicillins Rash 03/10/2014    Family History  Problem Relation Age of Onset   Colon cancer Father 55    Social History   Socioeconomic History   Marital status: Married    Spouse name: Not on file   Number of children: 1   Years of education: Not on file   Highest education level: Not on file  Occupational History   Not on file  Tobacco Use   Smoking status: Never   Smokeless tobacco: Never  Vaping Use   Vaping Use: Never  used  Substance and Sexual Activity   Alcohol use: No   Drug use: No   Sexual activity: Not on file  Other Topics Concern   Not on file  Social History Narrative   Not on file   Social Determinants of Health   Financial Resource Strain: Not on file  Food Insecurity: Not on file  Transportation Needs: Not on file  Physical Activity: Not on file  Stress: Not on file  Social Connections: Not on file  Intimate Partner Violence: Not on file    Review of Systems:  All systems reviewed an negative except where noted in HPI.  Gen: Denies any fever, chills, sweats, anorexia, fatigue, weakness, malaise, weight loss, and sleep disorder CV: Denies chest pain, angina, palpitations, syncope, orthopnea, PND, peripheral edema, and claudication. Resp: Denies dyspnea at rest, dyspnea with exercise, cough, sputum, wheezing, coughing up blood, and pleurisy. GI: Denies vomiting blood, jaundice, and fecal incontinence.   Denies dysphagia or odynophagia. GU : Denies urinary burning, blood in urine, urinary frequency, urinary hesitancy, nocturnal urination, and urinary incontinence. MS: Denies joint pain, limitation of movement, and swelling, stiffness, low back pain, extremity pain. Denies muscle weakness, cramps, atrophy.  Derm: Denies rash, itching, dry skin, hives, moles, warts, or unhealing ulcers.  Psych: Denies depression, anxiety, memory loss, suicidal ideation, hallucinations, paranoia, and confusion. Heme: Denies bruising, bleeding, and enlarged lymph nodes. Neuro:  Denies any headaches, dizziness, paresthesias. Endo:  Denies any problems with DM, thyroid, adrenal function.   Physical Exam:  General:  Alert, well-developed, in NAD Head:  Normocephalic and atraumatic. Eyes:  Sclera clear, no icterus.   Conjunctiva pink. Ears:  Normal auditory acuity. Mouth:  No deformity or lesions.  Neck:  Supple; no masses . Lungs:  Clear throughout to auscultation.   No wheezes, crackles, or  rhonchi. No acute distress. Heart:  Regular rate and rhythm; no murmurs. Abdomen:  Soft, nondistended, nontender. No masses, hepatomegaly. No obvious masses.  Normal bowel .    Rectal:  Deferred   Msk:  Symmetrical without gross deformities.. Pulses:  Normal pulses noted. Extremities:  Without edema. Neurologic:  Alert and  oriented x4;  grossly normal neurologically. Skin:  Intact without significant lesions or rashes. Cervical Nodes:  No significant cervical adenopathy. Psych:  Alert and cooperative. Normal mood and affect.   Impression / Plan:   Iron deficiency anemia, history of hematemesis and melena, family history of colon cancer, first-degree relative at age 84.  For colonoscopy and EGD.      Venita Lick. Russella Dar  09/22/2021, 2:10 PM See Loretha Stapler, [facility], to contact our on call provider

## 2021-09-22 NOTE — Op Note (Signed)
[facility] Patient Name: Maverick Patman Procedure Date: 09/22/2021 2:04 PM MRN: [id number] Endoscopist: Meryl Dare , MD Age: 45 Referring MD:  Date of Birth: Feb 24, 1976 Gender: Female Account #: [id number] Procedure:                Upper GI endoscopy Indications:              Unexplained iron deficiency anemia, Hematemesis Medicines:                Monitored Anesthesia Care Procedure:                Pre-Anesthesia Assessment:                           - Prior to the procedure, a History and Physical                            was performed, and patient medications and                            allergies were reviewed. The patient's tolerance of                            previous anesthesia was also reviewed. The risks                            and benefits of the procedure and the sedation                            options and risks were discussed with the patient.                            All questions were answered, and informed consent                            was obtained. Prior Anticoagulants: The patient has                            taken no previous anticoagulant or antiplatelet                            agents. ASA Grade Assessment: II - A patient with                            mild systemic disease. After reviewing the risks                            and benefits, the patient was deemed in                            satisfactory condition to undergo the procedure.                           After obtaining informed consent, the endoscope was  passed under direct vision. Throughout the                            procedure, the patient's blood pressure, pulse, and                            oxygen saturations were monitored continuously. The                            Endoscope was introduced through the mouth, and                            advanced to the second part of duodenum. The upper                             GI endoscopy was accomplished without difficulty.                            The patient tolerated the procedure well. Scope In: Scope Out: Findings:                 Patchy, white plaques were found in the mid                            esophagus. Biopsies were taken with a cold forceps                            for histology.                           The exam of the esophagus was otherwise normal.                           The entire examined stomach was normal. Biopsies                            were taken with a cold forceps for histology.                           The duodenal bulb and second portion of the                            duodenum were normal. Biopsies for histology were                            taken with a cold forceps for evaluation of celiac                            disease. Complications:            No immediate complications. Estimated Blood Loss:     Estimated blood loss was minimal. Impression:               - Esophageal plaques were found, suspicious for  candidiasis. Biopsied.                           - Normal stomach. Biopsied.                           - Normal duodenal bulb and second portion of the                            duodenum. Biopsied. Recommendation:           - Patient has a contact number available for                            emergencies. The signs and symptoms of potential                            delayed complications were discussed with the                            patient. Return to normal activities tomorrow.                            Written discharge instructions were provided to the                            patient.                           - Resume previous diet.                           - Continue present medications.                           - Await pathology results. Meryl Dare, MD 09/22/2021 2:55:45 PM This report has been signed electronically.

## 2021-09-23 ENCOUNTER — Telehealth: Payer: Self-pay | Admitting: *Deleted

## 2021-09-23 NOTE — Telephone Encounter (Signed)
  Follow up Call-  Call back number 09/22/2021  Post procedure Call Back phone  # [phone]  Permission to leave phone message Yes  Some recent data might be hidden     Patient questions:  Do you have a fever, pain , or abdominal swelling? No. Pain Score  0 *  Have you tolerated food without any problems? Yes.    Have you been able to return to your normal activities? Yes.    Do you have any questions about your discharge instructions: Diet   No. Medications  No. Follow up visit  No.  Do you have questions or concerns about your Care? No.  Actions: * If pain score is 4 or above: No action needed, pain <4.

## 2021-09-23 NOTE — Telephone Encounter (Signed)
Message left

## 2021-10-14 ENCOUNTER — Telehealth: Payer: Self-pay | Admitting: Gastroenterology

## 2021-10-14 MED ORDER — FLUCONAZOLE 100 MG PO TABS: ORAL_TABLET | ORAL | 0 refills | Status: AC

## 2021-10-14 NOTE — Telephone Encounter (Signed)
Patient notified of the recommendations and rx sent.  She will call back for any additional questions or concerns.

## 2021-10-14 NOTE — Telephone Encounter (Signed)
Patient calling for results of pathology from EGD 11/30.  Procedure report reviewed and candidiasis was suspected in the esophagus, but esophageal biopsies were normal.  She reports that she is on Pulmicort and is hoarse.  She has had to be treated for candida in the past.  She is requesting a rx for diflucan.    Dr. Rhea Belton you are MD of the day.  Please review in Dr. Ardell Isaacs absence.

## 2021-10-14 NOTE — Telephone Encounter (Signed)
Patient requesting results from 11/30 procedure. Please advise.

## 2021-10-14 NOTE — Telephone Encounter (Signed)
EGD reviewed Appearance consistent with Candida Ok to treat with fluconazole 200 mg x 1 day, 100 mg x 13 days JMP

## 2021-10-18 ENCOUNTER — Encounter: Payer: Self-pay | Admitting: Gastroenterology

## 2024-07-02 ENCOUNTER — Ambulatory Visit (INDEPENDENT_AMBULATORY_CARE_PROVIDER_SITE_OTHER)

## 2024-07-02 VITALS — BP 90/60 | HR 84 | Temp 97.3°F | Resp 16 | Ht 67.0 in | Wt 161.0 lb

## 2024-07-02 DIAGNOSIS — Z23 Encounter for immunization: Secondary | ICD-10-CM | POA: Diagnosis not present

## 2024-07-02 DIAGNOSIS — Z800 Family history of malignant neoplasm of digestive organs: Secondary | ICD-10-CM | POA: Diagnosis not present

## 2024-07-02 DIAGNOSIS — J3089 Other allergic rhinitis: Secondary | ICD-10-CM

## 2024-07-02 NOTE — Assessment & Plan Note (Signed)
 Uptodate with colonoscopies. Next due in 2027.

## 2024-07-02 NOTE — Assessment & Plan Note (Signed)
 Flu vaccine given today Advised to wait on recommendations regarding covid vaccine. Uptodate with everything else.

## 2024-07-02 NOTE — Progress Notes (Signed)
 New Patient Office Visit  Subjective    Patient ID: Donna Salazar, female    DOB: 02-03-1976  Age: 48 y.o. MRN: [id number]  CC:  Chief Complaint  Patient presents with   New Patient (Initial Visit)    HPI Donna Salazar presents to establish care  Discussed the use of AI scribe software for clinical note transcription with the patient, who gave verbal consent to proceed.  History of Present Illness   Donna Salazar is a 48 year old female who presents for a routine follow-up and to establish care with a primary care physician.  She is generally healthy.   Reports history of chronic cough and dyspnea for approximately 18 months following COVID-19 infection - Significant fatigue during this period - Extensive workup including pulmonary function tests, chest CTs, and echocardiograms without significant findings except for allergies to [place]  - Allergy shots initiated without benefit - Cough has since resolved - Previously diagnosed with asthma post-COVID, but not currently using inhalers and uncertain about accuracy of diagnosis  Weight management and appetite suppression - Initiated weight management program in February 2025 with initial weight near 200 pounds - Current weight is 161 pounds - Takes Zepbound weekly, attributing weight loss to decreased appetite and cravings - Continues follow-up with weight management clinic every four months  Left achilles tendinopathy - Left Achilles tendinitis since February - Exacerbation of symptoms following recent yard work - Swelling and pain in the left ankle - Cautious with footwear - Performs home stretching exercises, but initial overstretching worsened symptoms  Menstrual irregularity - Regular menstrual cycles - Two cycles in the previous month, attributed to stress from caring for father-in-law on hospice  Resolved anemia secondary to gastric ulcer - History of anemia due to gastric  ulcer, previously treated with iron and Prilosec - Anemia resolved; most recent hemoglobin 14.7  Musculoskeletal history - History of plantar fasciitis and scoliosis - Believes scoliosis contributes to left-sided musculoskeletal issues  Drug allergies - Allergy to penicillin and cephalosporins, causing rashes      Outpatient Encounter Medications as of 07/02/2024  Medication Sig   levocetirizine (XYZAL) 5 MG tablet Take 5 mg by mouth.   tirzepatide (ZEPBOUND) 7.5 MG/0.5ML Pen Inject 7.5 mg into the skin once a week.   [DISCONTINUED] albuterol  (PROVENTIL ) (2.5 MG/3ML) 0.083% nebulizer solution Take 3 mLs (2.5 mg total) by nebulization every 6 (six) hours as needed for wheezing or shortness of breath.   [DISCONTINUED] AUVI-Q  0.3 MG/0.3ML SOAJ injection Use as directed for life-threatening allergic reaction. (Patient not taking: Reported on 09/22/2021)   [DISCONTINUED] budesonide  (PULMICORT ) 0.5 MG/2ML nebulizer solution Take 2 mLs (0.5 mg total) by nebulization 2 (two) times daily.   [DISCONTINUED] cimetidine (TAGAMET) 200 MG tablet Take 200 mg by mouth daily.   [DISCONTINUED] ferrous sulfate  325 (65 FE) MG tablet Take 1 tablet (325 mg total) by mouth 2 (two) times daily with a meal.   [DISCONTINUED] Multiple Vitamins-Minerals (MULTIVITAMIN WOMEN PO) Take 1 mg by mouth daily.   [DISCONTINUED] omeprazole (PRILOSEC) 40 MG capsule Take 40 mg by mouth daily.   [DISCONTINUED] ondansetron  (ZOFRAN  ODT) 4 MG disintegrating tablet Take 1 tablet (4 mg total) by mouth every 6 (six) hours as needed for nausea or vomiting.   [DISCONTINUED] VITAMIN D PO Take 1 mg by mouth daily.   No facility-administered encounter medications on file as of 07/02/2024.    Past Medical History:  Diagnosis Date   Scoliosis (and kyphoscoliosis), idiopathic    Vitamin  D deficiency     Past Surgical History:  Procedure Laterality Date   BREAST ENHANCEMENT SURGERY  2000   CHOLECYSTECTOMY  10/25/2003   umbilical surgery   2016    Family History  Problem Relation Age of Onset   Hypertension Mother    Atrial fibrillation Mother    Macular degeneration Mother    Colon cancer Father 37   Cancer - Colon Father    Non-Hodgkin's lymphoma Maternal Grandmother    Alzheimer's disease Maternal Grandfather    Rectal cancer Paternal Grandmother     Social History   Socioeconomic History   Marital status: Married    Spouse name: Not on file   Number of children: 1   Years of education: Not on file   Highest education level: Not on file  Occupational History   Not on file  Tobacco Use   Smoking status: Never   Smokeless tobacco: Never  Vaping Use   Vaping status: Never Used  Substance and Sexual Activity   Alcohol use: Not Currently    Comment: rarely   Drug use: Never   Sexual activity: Yes    Partners: Male    Birth control/protection: Surgical  Other Topics Concern   Not on file  Social History Narrative   Not on file   Social Drivers of Health   Financial Resource Strain: Not on file  Food Insecurity: Not on file  Transportation Needs: Not on file  Physical Activity: Not on file  Stress: Not on file  Social Connections: Not on file  Intimate Partner Violence: Not on file    Review of Systems  Constitutional:  Negative for chills and fever.  HENT:  Negative for sinus pain and sore throat.   Respiratory:  Negative for cough.   Cardiovascular:  Negative for chest pain and palpitations.  Gastrointestinal:  Negative for abdominal pain, constipation, diarrhea, heartburn, nausea and vomiting.  Genitourinary:  Negative for dysuria and urgency.  Musculoskeletal:  Negative for myalgias.  Neurological:  Negative for dizziness and headaches.  Psychiatric/Behavioral:  Negative for depression and suicidal ideas. The patient is not nervous/anxious.         Objective    BP 90/60   Pulse 84   Temp (!) 97.3 F (36.3 C)   Resp 16   Ht 5' 7 (1.702 m)   Wt 161 lb (73 kg)   LMP 06/29/2024  (Exact Date)   SpO2 97%   BMI 25.22 kg/m   Physical Exam Vitals and nursing note reviewed.  Constitutional:      Appearance: Normal appearance.  HENT:     Head: Normocephalic and atraumatic.  Eyes:     Pupils: Pupils are equal, round, and reactive to light.  Cardiovascular:     Rate and Rhythm: Normal rate and regular rhythm.  Pulmonary:     Effort: Pulmonary effort is normal.     Breath sounds: Normal breath sounds.  Musculoskeletal:        General: Normal range of motion.     Cervical back: Normal range of motion.  Neurological:     General: No focal deficit present.     Mental Status: She is alert and oriented to person, place, and time.  Psychiatric:        Mood and Affect: Mood normal.        Behavior: Behavior normal.         Assessment & Plan:   Problem List Items Addressed This Visit     Family  history of colon cancer   Uptodate with colonoscopies. Next due in 2027.       Allergic rhinitis   Chronic allergic rhinitis managed with Xyzal 5 mg daily. Allergy shots provided limited benefit. No recent exacerbations reported. - Continue Xyzal 5 mg daily      Encounter for immunization - Primary   Flu vaccine given today Advised to wait on recommendations regarding covid vaccine. Uptodate with everything else.       Relevant Orders   Flu vaccine trivalent PF, 6mos and older(Flulaval,Afluria,Fluarix,Fluzone) (Completed)   Achilles tendinitis, left Chronic Achilles tendinitis in the left ankle since February, exacerbated by recent yard work. Mild swelling present. Managed with supportive shoes and stretching exercises, though overstretching initially worsened the condition. - Continue supportive footwear - Continue stretching exercises with caution to avoid overstretching  Weight management previously with a weight close to 200 lbs, now reduced to 161 lbs with a BMI of 25 through weight loss therapy including Zepbound and lifestyle modifications. Improved  appetite control with Zepbound. Considering future dose adjustments. Emphasis on maintaining lifestyle changes including diet and exercise. Discussed the importance of not solely relying on medication and balancing with lifestyle changes. - Continue Zepbound as prescribed - Maintain lifestyle modifications including diet and exercise - Consider future dose adjustments of Zepbound with weight management clinic  Scoliosis Chronic scoliosis contributing to left-sided limb discomfort.  General Health Maintenance Routine screenings and vaccinations discussed. Mammogram done annually in December. Pap smear due, with discussion on guidelines for frequency based on past results. Flu shot recommended and administered. Tetanus shot status unclear but likely up to date. Discussed COVID vaccine status and current recommendations. - Schedule Pap smear if desired, otherwise follow up in 5 years if previous results were normal - Continue annual mammograms - Administer flu shot - Review tetanus shot status - Discuss COVID vaccine updates as per CDC recommendations       Return in about 1 year (around 07/02/2025) for wellness exam.   Aylinn Rydberg, MD

## 2024-07-02 NOTE — Assessment & Plan Note (Signed)
 Chronic allergic rhinitis managed with Xyzal 5 mg daily. Allergy shots provided limited benefit. No recent exacerbations reported. - Continue Xyzal 5 mg daily

## 2024-07-02 NOTE — Patient Instructions (Signed)
  VISIT SUMMARY: You came in for a routine follow-up and to establish care. We discussed your chronic cough and shortness of breath, weight management, left Achilles tendinitis, menstrual irregularity, resolved anemia, and scoliosis. We also reviewed your general health maintenance, including routine screenings and vaccinations.  YOUR PLAN: ACHILLES TENDINITIS, LEFT: You have chronic Achilles tendinitis in your left ankle, which has been worse since you did some yard work. -Continue wearing supportive footwear. -Keep doing your stretching exercises, but be careful not to overstretch.  You have successfully reduced your weight from nearly 200 lbs to 161 lbs with the help of Zepbound and lifestyle changes. -Continue taking Zepbound as prescribed. -Maintain your lifestyle changes, including diet and exercise. -Consider future dose adjustments of Zepbound with your weight management clinic.  ALLERGIC RHINITIS: You have chronic allergic rhinitis, which is being managed with Xyzal. -Continue taking Xyzal 5 mg daily.  SCOLIOSIS: You have chronic scoliosis, which contributes to left-sided limb discomfort. -No specific changes to your current management plan were discussed.  GENERAL HEALTH MAINTENANCE: We discussed routine screenings and vaccinations. -Schedule a Pap smear if desired, otherwise follow up in 5 years if previous results were normal. -Continue annual mammograms. -You received a flu shot today. -Review your tetanus shot status. -Stay updated on COVID vaccine recommendations as per CDC guidelines.                      Contains text generated by [employer].                                 Contains text generated by [employer].

## 2024-07-23 LAB — LAB REPORT - SCANNED: EGFR: 102

## 2024-08-01 ENCOUNTER — Ambulatory Visit: Payer: Self-pay

## 2024-08-01 ENCOUNTER — Ambulatory Visit: Payer: Self-pay | Admitting: *Deleted

## 2024-08-01 ENCOUNTER — Ambulatory Visit

## 2024-08-01 VITALS — BP 120/60 | HR 76 | Temp 97.6°F | Resp 16 | Ht 67.0 in | Wt 161.0 lb

## 2024-08-01 DIAGNOSIS — R300 Dysuria: Secondary | ICD-10-CM | POA: Insufficient documentation

## 2024-08-01 LAB — POCT URINALYSIS DIP (CLINITEK)
Bilirubin, UA: NEGATIVE
Glucose, UA: NEGATIVE mg/dL
Ketones, POC UA: NEGATIVE mg/dL
Nitrite, UA: POSITIVE — AB
POC PROTEIN,UA: NEGATIVE
Spec Grav, UA: 1.015 (ref 1.010–1.025)
Urobilinogen, UA: 0.2 U/dL
pH, UA: 6 (ref 5.0–8.0)

## 2024-08-01 MED ORDER — ONDANSETRON HCL 4 MG PO TABS: 4.0000 mg | ORAL_TABLET | Freq: Three times a day (TID) | ORAL | 0 refills | Status: AC | PRN

## 2024-08-01 MED ORDER — SULFAMETHOXAZOLE-TRIMETHOPRIM 800-160 MG PO TABS: 1.0000 | ORAL_TABLET | Freq: Two times a day (BID) | ORAL | 0 refills | Status: AC

## 2024-08-01 NOTE — Progress Notes (Signed)
 Acute Office Visit  Subjective:    Patient ID: Donna Salazar, female    DOB: 02-18-1976, 48 y.o.   MRN: [id number]  Chief Complaint  Patient presents with   Urinary Frequency    HPI: Discussed the use of AI scribe software for clinical note transcription with the patient, who gave verbal consent to proceed.  History of Present Illness   Donna Salazar is a 48 year old female who presents with symptoms suggestive of a urinary tract infection (UTI).  Lower urinary tract symptoms - Dysuria and increased urinary frequency since Monday, following a four-hour car ride without adequate hydration or restroom breaks - No visible hematuria, but suspects its presence - Intermittent suprapubic tenderness since August - No fever, chills, or back pain - Nausea present - Azo provides temporary symptom relief  Urinalysis findings - Recent urinalysis showed high specific gravity, presence of red blood cells, some protein, negative nitrite, and mild leukocytes  History of recurrent urinary tract infections - History of UTI in August, with incomplete resolution of symptoms - Previously treated with Macrobid and Bactrim for UTIs, with Bactrim providing relief  Impact on daily activities - Concerned about managing symptoms due to upcoming travel to [place]  for daughter's soccer tournament this weekend          Past Medical History:  Diagnosis Date   Scoliosis (and kyphoscoliosis), idiopathic    Vitamin D deficiency     Past Surgical History:  Procedure Laterality Date   BREAST ENHANCEMENT SURGERY  2000   CHOLECYSTECTOMY  10/25/2003   umbilical surgery  2016    Family History  Problem Relation Age of Onset   Hypertension Mother    Atrial fibrillation Mother    Macular degeneration Mother    Colon cancer Father 39   Cancer - Colon Father    Non-Hodgkin's lymphoma Maternal Grandmother    Alzheimer's disease Maternal Grandfather    Rectal cancer Paternal  Grandmother     Social History   Socioeconomic History   Marital status: Married    Spouse name: Not on file   Number of children: 1   Years of education: Not on file   Highest education level: Not on file  Occupational History   Not on file  Tobacco Use   Smoking status: Never   Smokeless tobacco: Never  Vaping Use   Vaping status: Never Used  Substance and Sexual Activity   Alcohol use: Not Currently    Comment: rarely   Drug use: Never   Sexual activity: Yes    Partners: Male    Birth control/protection: Surgical  Other Topics Concern   Not on file  Social History Narrative   Not on file   Social Drivers of Health   Financial Resource Strain: Not on file  Food Insecurity: Not on file  Transportation Needs: Not on file  Physical Activity: Not on file  Stress: Not on file  Social Connections: Not on file  Intimate Partner Violence: Not on file    Outpatient Medications Prior to Visit  Medication Sig Dispense Refill   levocetirizine (XYZAL) 5 MG tablet Take 5 mg by mouth.     ondansetron  (ZOFRAN ) 8 MG tablet SMARTSIG:1 Tablet(s) By Mouth 1-3 Times Daily PRN     tirzepatide (ZEPBOUND) 7.5 MG/0.5ML Pen Inject 7.5 mg into the skin once a week.     No facility-administered medications prior to visit.    Allergies  Allergen Reactions   Penicillin G Other (See Comments)  Cefdinir Rash and Dermatitis   Cephalosporins Rash   Penicillins Rash    Review of Systems  Constitutional:  Negative for chills, fatigue and fever.  HENT:  Negative for congestion, ear pain and sore throat.   Respiratory:  Negative for cough and shortness of breath.   Cardiovascular:  Negative for chest pain and palpitations.  Gastrointestinal:  Positive for abdominal pain (lower abdomen) and nausea. Negative for constipation, diarrhea and vomiting.  Endocrine: Negative for polydipsia, polyphagia and polyuria.  Genitourinary:  Positive for dysuria and frequency. Negative for difficulty  urinating.  Musculoskeletal:  Negative for arthralgias, back pain and myalgias.  Skin:  Negative for rash.  Neurological:  Negative for headaches.  Psychiatric/Behavioral:  Negative for dysphoric mood. The patient is not nervous/anxious.        Objective:        08/01/2024   10:29 AM 07/02/2024    9:58 AM 09/22/2021    3:10 PM  Vitals with BMI  Height 5' 7 5' 7   Weight 161 lbs 161 lbs   BMI 25.21 25.21   Systolic 120 90 101  Diastolic 60 60 68  Pulse 76 84 63    No data found.   Physical Exam Vitals and nursing note reviewed.  Constitutional:      Appearance: Normal appearance.  HENT:     Head: Normocephalic and atraumatic.  Cardiovascular:     Rate and Rhythm: Normal rate and regular rhythm.  Pulmonary:     Breath sounds: Normal breath sounds.  Abdominal:     Tenderness: There is abdominal tenderness (mild suprapubic, RL and LL abdominal tenderness).  Musculoskeletal:        General: Normal range of motion.  Skin:    General: Skin is warm.  Neurological:     General: No focal deficit present.     Mental Status: She is alert and oriented to person, place, and time.  Psychiatric:        Mood and Affect: Mood normal.     Health Maintenance Due  Topic Date Due   HIV Screening  Never done   Hepatitis C Screening  Never done   DTaP/Tdap/Td (1 - Tdap) Never done   Hepatitis B Vaccines 19-59 Average Risk (1 of 3 - 19+ 3-dose series) Never done   Cervical Cancer Screening (HPV/Pap Cotest)  Never done   Mammogram  Never done   COVID-19 Vaccine (2 - 2025-26 season) 06/24/2024       Topic Date Due   Hepatitis B Vaccines 19-59 Average Risk (1 of 3 - 19+ 3-dose series) Never done     No results found for: TSH Lab Results  Component Value Date   WBC 8.2 08/12/2021   HGB 10.8 (L) 08/12/2021   HCT 34.0 (L) 08/12/2021   MCV 71.5 (L) 08/12/2021   PLT 491.0 (H) 08/12/2021   No results found for: NA, K, CHLORIDE, CO2, GLUCOSE, BUN, CREATININE,  BILITOT, ALKPHOS, AST, ALT, PROT, ALBUMIN, CALCIUM, ANIONGAP, EGFR, GFR No results found for: CHOL No results found for: HDL No results found for: LDLCALC No results found for: TRIG No results found for: CHOLHDL No results found for: YHAJ8R      Results for orders placed or performed in visit on 08/12/21  CBC with Differential/Platelet   Collection Time: 08/12/21 10:49 AM  Result Value Ref Range   WBC 8.2 4.0 - 10.5 K/uL   RBC 4.76 3.87 - 5.11 Mil/uL   Hemoglobin 10.8 (L) 12.0 - 15.0 g/dL  HCT 34.0 (L) 36.0 - 46.0 %   MCV 71.5 (L) 78.0 - 100.0 fl   MCHC 31.6 30.0 - 36.0 g/dL   RDW 81.1 (H) 88.4 - 84.4 %   Platelets 491.0 (H) 150.0 - 400.0 K/uL   Neutrophils Relative % 72.2 43.0 - 77.0 %   Lymphocytes Relative 20.0 12.0 - 46.0 %   Monocytes Relative 5.8 3.0 - 12.0 %   Eosinophils Relative 1.1 0.0 - 5.0 %   Basophils Relative 0.9 0.0 - 3.0 %   Neutro Abs 5.9 1.4 - 7.7 K/uL   Lymphs Abs 1.6 0.7 - 4.0 K/uL   Monocytes Absolute 0.5 0.1 - 1.0 K/uL   Eosinophils Absolute 0.1 0.0 - 0.7 K/uL   Basophils Absolute 0.1 0.0 - 0.1 K/uL  IBC + Ferritin   Collection Time: 08/12/21 10:49 AM  Result Value Ref Range   Iron 39 (L) 42 - 145 ug/dL   Transferrin 588.9 (H) 212.0 - 360.0 mg/dL   Saturation Ratios 6.8 (L) 20.0 - 50.0 %   Ferritin 4.9 (L) 10.0 - 291.0 ng/mL   TIBC 575.4 (H) 250.0 - 450.0 mcg/dL  Vitamin B12   Collection Time: 08/12/21 10:49 AM  Result Value Ref Range   Vitamin B-12 397 211 - 911 pg/mL  Folate   Collection Time: 08/12/21 10:49 AM  Result Value Ref Range   Folate >23.4 >5.9 ng/mL     Assessment & Plan:   Assessment & Plan Dysuria Urinary tract infection Recurrent urinary tract infection with dysuria and frequency since Monday. Previous UTI in August. Urinalysis shows high specific gravity, 10 RBCs, some protein, negative nitrite, and mild leukocyte presence. No fever, chills, or back pain reported. Urine culture pending to  guide antibiotic choice. - Send urine for culture - Prescribe Bactrim DS for 7 days - Advise to maintain adequate water intake - Monitor for worsening symptoms such as fever, chills, or back pain  Nausea - Prescribe Zofran , 15 tablets, to be taken up to three times daily as needed - Advise to take Bactrim with food  Penicillin allergy Reported penicillin allergy with rash as a reaction. No history of anaphylaxis. - Avoid penicillin and related antibiotics  Orders:   POCT URINALYSIS DIP (CLINITEK)   Urine Culture     Body mass index is 25.22 kg/m.SABRA  Assessment and Plan               No orders of the defined types were placed in this encounter.   No orders of the defined types were placed in this encounter.    Follow-up: No follow-ups on file.  An After Visit Summary was printed and given to the patient.  Kirandeep Fariss, MD [facility] [phone]

## 2024-08-01 NOTE — Telephone Encounter (Signed)
 FYI Only or Action Required?: FYI only for provider.  Patient was last seen in primary care on 07/02/2024 by Donna Salazar, Mamatha, MD.  Called Nurse Triage reporting Urinary Frequency.  Symptoms began several days ago.  Interventions attempted: OTC medications: pyridium.  Symptoms are: gradually worsening.  Triage Disposition: See Physician Within 24 Hours  Patient/caregiver understands and will follow disposition?: Yes                Copied from CRM [id number]. Topic: Clinical - Red Word Triage >> Aug 01, 2024  8:37 AM Wess RAMAN wrote: Red Word that prompted transfer to Nurse Triage: UTI- Pain with urination, Frequent urination Reason for Disposition  More than 2 UTI's in last year  Answer Assessment - Initial Assessment Questions Appt today      1. SEVERITY: How bad is the pain?  (e.g., Scale 1-10; mild, moderate, or severe)     5/10 2. FREQUENCY: How many times have you had painful urination today?      Na  3. PATTERN: Is pain present every time you urinate or just sometimes?      Na  4. ONSET: When did the painful urination start?      Monday  5. FEVER: Do you have a fever? If Yes, ask: What is your temperature, how was it measured, and when did it start?     na 6. PAST UTI: Have you had a urine infection before? If Yes, ask: When was the last time? and What happened that time?      Yes in August 7. CAUSE: What do you think is causing the painful urination?  (e.g., UTI, scratch, Herpes sore)     UTI 8. OTHER SYMPTOMS: Do you have any other symptoms? (e.g., blood in urine, flank pain, genital sores, urgency, vaginal discharge)     Urine frequency, pain, taking pyridium.  9. PREGNANCY: Is there any chance you are pregnant? When was your last menstrual period?     na  Protocols used: Urination Pain - Female-A-AH

## 2024-08-01 NOTE — Assessment & Plan Note (Addendum)
 Urinary tract infection Recurrent urinary tract infection with dysuria and frequency since Monday. Previous UTI in August. Urinalysis shows high specific gravity, 10 RBCs, some protein, negative nitrite, and mild leukocyte presence. No fever, chills, or back pain reported. Urine culture pending to guide antibiotic choice. - Send urine for culture - Prescribe Bactrim DS for 7 days - Advise to maintain adequate water intake - Monitor for worsening symptoms such as fever, chills, or back pain  Nausea - Prescribe Zofran , 15 tablets, to be taken up to three times daily as needed - Advise to take Bactrim with food  Penicillin allergy Reported penicillin allergy with rash as a reaction. No history of anaphylaxis. - Avoid penicillin and related antibiotics  Orders:   POCT URINALYSIS DIP (CLINITEK)   Urine Culture

## 2024-08-05 LAB — URINE CULTURE

## 2024-08-06 MED ORDER — CIPROFLOXACIN HCL 500 MG PO TABS: 500.0000 mg | ORAL_TABLET | Freq: Two times a day (BID) | ORAL | 0 refills | Status: AC

## 7364-01-23 DEATH — deceased
